# Patient Record
Sex: Male | Born: 1960 | Race: Black or African American | Hispanic: No | Marital: Single | Smoking: Current every day smoker
Health system: Southern US, Community
[De-identification: ages and names within clinical notes are randomized; demographics above are authoritative.]

## PROBLEM LIST (undated history)

## (undated) DIAGNOSIS — I252 Old myocardial infarction: Secondary | ICD-10-CM

## (undated) HISTORY — PX: NO PAST SURGERIES: SHX2092

---

## 1998-01-03 ENCOUNTER — Emergency Department (HOSPITAL_COMMUNITY): Admission: EM | Admit: 1998-01-03 | Discharge: 1998-01-03 | Payer: Self-pay | Admitting: Emergency Medicine

## 2002-11-10 ENCOUNTER — Encounter: Payer: Self-pay | Admitting: Emergency Medicine

## 2002-11-10 ENCOUNTER — Inpatient Hospital Stay (HOSPITAL_COMMUNITY): Admission: EM | Admit: 2002-11-10 | Discharge: 2002-11-13 | Payer: Self-pay | Admitting: Emergency Medicine

## 2003-06-15 ENCOUNTER — Emergency Department (HOSPITAL_COMMUNITY): Admission: EM | Admit: 2003-06-15 | Discharge: 2003-06-15 | Payer: Self-pay | Admitting: Emergency Medicine

## 2003-06-18 ENCOUNTER — Emergency Department (HOSPITAL_COMMUNITY): Admission: AD | Admit: 2003-06-18 | Discharge: 2003-06-18 | Payer: Self-pay | Admitting: Family Medicine

## 2004-02-01 ENCOUNTER — Emergency Department (HOSPITAL_COMMUNITY): Admission: EM | Admit: 2004-02-01 | Discharge: 2004-02-01 | Payer: Self-pay | Admitting: Family Medicine

## 2004-04-20 ENCOUNTER — Emergency Department (HOSPITAL_COMMUNITY): Admission: EM | Admit: 2004-04-20 | Discharge: 2004-04-20 | Payer: Self-pay | Admitting: Family Medicine

## 2005-03-23 ENCOUNTER — Encounter: Admission: RE | Admit: 2005-03-23 | Discharge: 2005-03-23 | Payer: Self-pay | Admitting: Occupational Medicine

## 2006-09-28 ENCOUNTER — Emergency Department (HOSPITAL_COMMUNITY): Admission: EM | Admit: 2006-09-28 | Discharge: 2006-09-28 | Payer: Self-pay | Admitting: Emergency Medicine

## 2014-03-13 ENCOUNTER — Emergency Department (HOSPITAL_COMMUNITY)
Admission: EM | Admit: 2014-03-13 | Discharge: 2014-03-13 | Disposition: A | Discharge status: Home / Self Care | Payer: Self-pay | Attending: Emergency Medicine | Admitting: Emergency Medicine

## 2014-03-13 ENCOUNTER — Encounter (HOSPITAL_COMMUNITY): Payer: Self-pay | Admitting: Emergency Medicine

## 2014-03-13 DIAGNOSIS — F17200 Nicotine dependence, unspecified, uncomplicated: Secondary | ICD-10-CM | POA: Insufficient documentation

## 2014-03-13 DIAGNOSIS — K122 Cellulitis and abscess of mouth: Secondary | ICD-10-CM | POA: Insufficient documentation

## 2014-03-13 DIAGNOSIS — Z880 Allergy status to penicillin: Secondary | ICD-10-CM | POA: Insufficient documentation

## 2014-03-13 DIAGNOSIS — I252 Old myocardial infarction: Secondary | ICD-10-CM | POA: Insufficient documentation

## 2014-03-13 DIAGNOSIS — K1370 Unspecified lesions of oral mucosa: Secondary | ICD-10-CM | POA: Insufficient documentation

## 2014-03-13 HISTORY — DX: Old myocardial infarction: I25.2

## 2014-03-13 MED ORDER — CLINDAMYCIN HCL 150 MG PO CAPS: 450.0000 mg | ORAL_CAPSULE | Freq: Three times a day (TID) | ORAL | Status: DC

## 2014-03-13 NOTE — ED Notes (Signed)
Heather, PA at the bedside.

## 2014-03-13 NOTE — Discharge Instructions (Signed)
Gargle with warm salt water.

## 2014-03-13 NOTE — ED Provider Notes (Signed)
CSN: [id number]     Arrival date & time 03/13/14  2633 History   First MD Initiated Contact with Patient 03/13/14 [phone]     Chief Complaint  Patient presents with  . mouth pain      (Consider location/radiation/quality/duration/timing/severity/associated sxs/prior Treatment) HPI Comments: Patient presents with an intermittent lesion of the right lower oral mucosa that has been present for 6-7 months.  He states that he has attempted to squeeze the area and got some pus out of it.  He reports that the lesion is tender.  He denies any injury to the area.  Denies any dental pain.  Denies any fever, chills, nausea, vomiting, or difficulty swallowing.    The history is provided by the patient.    Past Medical History  Diagnosis Date  . MI, old    History reviewed. No pertinent past surgical history. No family history on file. History  Substance Use Topics  . Smoking status: Current Every Day Smoker -- 0.50 packs/day    Types: Cigarettes  . Smokeless tobacco: Not on file  . Alcohol Use: Yes     Comment: occ    Review of Systems  Constitutional: Negative for fever and chills.  HENT: Positive for mouth sores. Negative for dental problem and facial swelling.       Allergies  Penicillins  Home Medications   Prior to Admission medications   Not on File   BP 136/93  Pulse 72  Temp(Src) 98.1 F (36.7 C) (Oral)  Resp 16  SpO2 97% Physical Exam  Nursing note and vitals reviewed. Constitutional: He appears well-developed and well-nourished.  HENT:  Head: Normocephalic and atraumatic.  Mouth/Throat: Uvula is midline and oropharynx is clear and moist. Oral lesions present. No trismus in the jaw.  1 cm abscess of the right lower oral mucosa.  No active drainage.    Neck: Normal range of motion. Neck supple.  Cardiovascular: Normal rate, regular rhythm and normal heart sounds.   Pulmonary/Chest: Effort normal and breath sounds normal.  Musculoskeletal: Normal range of motion.   Neurological: He is alert.  Skin: Skin is warm and dry.  Psychiatric: He has a normal mood and affect.    ED Course  Procedures (including critical care time) Labs Review Labs Reviewed - No data to display  Imaging Review No results found.   EKG Interpretation None     INCISION AND DRAINAGE Performed by: Hyman Bible Consent: Verbal consent obtained. Risks and benefits: risks, benefits and alternatives were discussed Type: abscess  Body area: oral mucosa  Anesthesia: local infiltration  Incision was made with a scalpel.  Local anesthetic: lidocaine 2% with epinephrine  Anesthetic total: 2 ml  Complexity: complex Blunt dissection to break up loculations  Drainage: purulent  Drainage amount: small  Patient tolerance: Patient tolerated the procedure well with no immediate complications.   MDM   Final diagnoses:  None   Patient presenting with an abscess of the oral mucosa.  Area incised and drained with good results.  Patient is afebrile.  Patient stable for discharge.  Patient discharged home with Rx for Clindamycin.  Stable for discharge.  Return precautions given.    Hyman Bible, PA-C 03/13/14 1725

## 2014-03-13 NOTE — ED Notes (Signed)
Pt to ED c/o sore to inside of R lower lip for several months.  He states he has been squeezing pus out of it, but in the past few days it has become a hard knot.

## 2014-03-14 NOTE — ED Provider Notes (Signed)
Medical screening examination/treatment/procedure(s) were performed by non-physician practitioner and as supervising physician I was immediately available for consultation/collaboration.   EKG Interpretation None        Debby Freiberg, MD 03/14/14 1428

## 2015-03-28 ENCOUNTER — Encounter (HOSPITAL_COMMUNITY): Payer: Self-pay | Admitting: Emergency Medicine

## 2015-03-28 ENCOUNTER — Emergency Department (HOSPITAL_COMMUNITY)
Admission: EM | Admit: 2015-03-28 | Discharge: 2015-03-28 | Disposition: A | Discharge status: Home / Self Care | Payer: Self-pay | Attending: Emergency Medicine | Admitting: Emergency Medicine

## 2015-03-28 DIAGNOSIS — Z792 Long term (current) use of antibiotics: Secondary | ICD-10-CM | POA: Insufficient documentation

## 2015-03-28 DIAGNOSIS — Z720 Tobacco use: Secondary | ICD-10-CM | POA: Insufficient documentation

## 2015-03-28 DIAGNOSIS — L259 Unspecified contact dermatitis, unspecified cause: Secondary | ICD-10-CM | POA: Insufficient documentation

## 2015-03-28 DIAGNOSIS — Z880 Allergy status to penicillin: Secondary | ICD-10-CM | POA: Insufficient documentation

## 2015-03-28 DIAGNOSIS — I252 Old myocardial infarction: Secondary | ICD-10-CM | POA: Insufficient documentation

## 2015-03-28 MED ORDER — DEXAMETHASONE SODIUM PHOSPHATE 10 MG/ML IJ SOLN
10.0000 mg | Freq: Once | INTRAMUSCULAR | Status: AC
  Administered 2015-03-28: 10 mg via INTRAMUSCULAR
  Filled 2015-03-28: qty 1

## 2015-03-28 NOTE — ED Notes (Signed)
Saltines and water provided

## 2015-03-28 NOTE — ED Notes (Signed)
Declined W/C at D/C and was escorted to lobby by RN. 

## 2015-03-28 NOTE — Discharge Instructions (Signed)
Please read and follow all provided instructions.  Your diagnoses today include:  1. Contact dermatitis    Tests performed today include:  Vital signs. See below for your results today.   Medications prescribed:   None  Take any prescribed medications only as directed.  Home care instructions:   Follow any educational materials contained in this packet  Follow-up instructions: Please follow-up with your primary care provider in the next 3 days for further evaluation of your symptoms if not improved.   Return instructions:   Please return to the Emergency Department if you experience worsening symptoms.   Call 9-1-1 immediately if you have an allergic reaction that involves your lips, mouth, throat or if you have any difficulty breathing. This is a life-threatening emergency.   Please return if you have any other emergent concerns.  Additional Information:  Your vital signs today were: BP 121/70 mmHg   Pulse 63   Temp(Src) 98.3 F (36.8 C) (Oral)   Ht 5\' 11"  (1.803 m)   Wt 145 lb (65.772 kg)   BMI 20.23 kg/m2   SpO2 95% If your blood pressure (BP) was elevated above 135/85 this visit, please have this repeated by your doctor within one month. --------------

## 2015-03-28 NOTE — ED Notes (Signed)
Rash to right forearm and right leg. raised red. Pruritic. Cortisone cream tried at home. NO change. NAD. Ambulatory with steady gait

## 2015-03-28 NOTE — ED Provider Notes (Signed)
CSN: [id number]     Arrival date & time 03/28/15  1314 History  This chart was scribed for Carlisle Cater, PA-C, working with Merrily Pew, MD by Starleen Arms, ED Scribe. This patient was seen in room TR07C/TR07C and the patient's care was started at 2:40 PM.   Chief Complaint  Patient presents with  . Rash   The history is provided by the patient. No language interpreter was used.    HPI Comments: Bryan Curtis is a 54 y.o. male who presents to the Emergency Department complaining of an itching rash onset yesterday, not relieved by cortisone/topical alcohol.  The patient initially noticed a single red area on the forearm yesterday after waking and noticed 3 similar areas on the right upper leg this morning after waking.  He reports he cleared weeds, including poison ivy, from around his shed two days ago.  He denies new living environment.     Past Medical History  Diagnosis Date  . MI, old    History reviewed. No pertinent past surgical history. History reviewed. No pertinent family history. Social History  Substance Use Topics  . Smoking status: Current Every Day Smoker -- 0.50 packs/day    Types: Cigarettes  . Smokeless tobacco: None  . Alcohol Use: Yes     Comment: occ    Review of Systems  Constitutional: Negative for fever.  HENT: Negative for facial swelling and trouble swallowing.   Eyes: Negative for redness.  Respiratory: Negative for shortness of breath, wheezing and stridor.   Cardiovascular: Negative for chest pain.  Gastrointestinal: Negative for nausea and vomiting.  Musculoskeletal: Negative for myalgias.  Skin: Positive for rash.  Neurological: Negative for light-headedness.  Psychiatric/Behavioral: Negative for confusion.      Allergies  Penicillins  Home Medications   Prior to Admission medications   Medication Sig Start Date End Date Taking? Authorizing Provider  clindamycin (CLEOCIN) 150 MG capsule Take 3 capsules (450 mg total) by mouth 3 (three)  times daily. 03/13/14   Heather Laisure, PA-C   BP 121/70 mmHg  Pulse 63  Temp(Src) 98.3 F (36.8 C) (Oral)  Ht 5\' 11"  (1.803 m)  Wt 145 lb (65.772 kg)  BMI 20.23 kg/m2  SpO2 95% Physical Exam  Constitutional: He is oriented to person, place, and time. He appears well-developed and well-nourished. No distress.  HENT:  Head: Normocephalic and atraumatic.  Eyes: Conjunctivae and EOM are normal.  Neck: Normal range of motion. Neck supple. No tracheal deviation present.  Cardiovascular: Normal rate.   Pulmonary/Chest: Effort normal. No respiratory distress.  Musculoskeletal: Normal range of motion.  Neurological: He is alert and oriented to person, place, and time.  Skin: Skin is warm and dry.  Patient with linear rash on right forearm and larger discrete wheals on lower right extremity consistent with contact dermatitis.  Psychiatric: He has a normal mood and affect. His behavior is normal.  Nursing note and vitals reviewed.   ED Course  Procedures (including critical care time)  DIAGNOSTIC STUDIES: Oxygen Saturation is 95% on RA, adequate by my interpretation.    COORDINATION OF CARE:  2:45 PM Will order IM steroids.  Patient may use OTC benadryl.  Patient acknowledges and agrees with plan.    Labs Review Labs Reviewed - No data to display  Imaging Review No results found. I have personally reviewed and evaluated these images and lab results as part of my medical decision-making.   EKG Interpretation None       Vital signs reviewed  and are as follows: Filed Vitals:   03/28/15 1333  BP: 121/70  Pulse: 63  Temp: 98.3 F (36.8 C)   Patient counseled to continue his steroid cream and that he should return if rash becomes worse.  MDM   Final diagnoses:  Contact dermatitis   Patient with itchy rash consistent with contact dermatitis. No signs of anaphylaxis. No other systemic symptoms of illness.  I personally performed the services described in this  documentation, which was scribed in my presence. The recorded information has been reviewed and is accurate.    Carlisle Cater, PA-C 03/28/15 1514  Merrily Pew, MD 03/30/15 [phone]

## 2015-04-27 ENCOUNTER — Encounter (HOSPITAL_COMMUNITY): Payer: Self-pay | Admitting: Emergency Medicine

## 2015-04-27 ENCOUNTER — Emergency Department (HOSPITAL_COMMUNITY)
Admission: EM | Admit: 2015-04-27 | Discharge: 2015-04-27 | Disposition: A | Discharge status: Home / Self Care | Payer: Self-pay | Attending: Emergency Medicine | Admitting: Emergency Medicine

## 2015-04-27 DIAGNOSIS — Z720 Tobacco use: Secondary | ICD-10-CM | POA: Insufficient documentation

## 2015-04-27 DIAGNOSIS — I252 Old myocardial infarction: Secondary | ICD-10-CM | POA: Insufficient documentation

## 2015-04-27 DIAGNOSIS — R21 Rash and other nonspecific skin eruption: Secondary | ICD-10-CM

## 2015-04-27 DIAGNOSIS — L237 Allergic contact dermatitis due to plants, except food: Secondary | ICD-10-CM

## 2015-04-27 DIAGNOSIS — Z792 Long term (current) use of antibiotics: Secondary | ICD-10-CM | POA: Insufficient documentation

## 2015-04-27 DIAGNOSIS — L259 Unspecified contact dermatitis, unspecified cause: Secondary | ICD-10-CM

## 2015-04-27 DIAGNOSIS — Z880 Allergy status to penicillin: Secondary | ICD-10-CM | POA: Insufficient documentation

## 2015-04-27 MED ORDER — HYDROXYZINE HCL 25 MG PO TABS: 25.0000 mg | ORAL_TABLET | Freq: Four times a day (QID) | ORAL | Status: DC | PRN

## 2015-04-27 MED ORDER — PREDNISONE 20 MG PO TABS: ORAL_TABLET | ORAL | Status: DC

## 2015-04-27 NOTE — ED Provider Notes (Signed)
CSN: [id number]     Arrival date & time 04/27/15  0906 History  By signing my name below, I, Starleen Arms, attest that this documentation has been prepared under the direction and in the presence of Wileen Duncanson Camprubi-Soms, PA-C. Electronically Signed: Starleen Arms ED Scribe. 04/27/2015. 9:21 AM.   Chief Complaint  Patient presents with  . Rash   Patient is a 54 y.o. male presenting with rash. The history is provided by the patient and medical records. No language interpreter was used.  Rash Location:  Full body Quality: itchiness and redness   Quality: not draining, not painful and not weeping   Severity:  Moderate Onset quality:  Gradual Duration:  1 month Timing:  Constant Progression:  Spreading Chronicity:  New Context: plant contact   Context: not animal contact, not exposure to similar rash, not food, not insect bite/sting, not medications, not new detergent/soap and not sick contacts   Relieved by:  Topical steroids Worsened by:  Nothing tried Ineffective treatments:  None tried Associated symptoms: no abdominal pain, no diarrhea, no fever, no joint pain, no myalgias, no nausea, no periorbital edema, no shortness of breath, no throat swelling, no tongue swelling and not vomiting    HPI Comments: Bryan Curtis is a 54 y.o. male who presents to the Emergency Department complaining of moderate, constant, slightly worsened, non-draining pruritic rash over the entire body onset 1 month ago; treated with OTC benadryl (no relief), topical cortisone ( with itching relief), and decadron (1 month ago in ED, no relief);  no aggravating factors.  The patient was seen in the ED for the same on 9/18.  At that time he reported having cleared weeds from around his shed and contacting poison ivy.  He was dx'd with contact dermatitis and given IM decadron, without improvement.  He denies new exposures, new living environment, sick contacts with similar symptoms, new soaps/detergents, new  medications, new foods, or new animal contacts.  He denies fever, chills, CP, SOB, abdominal pain, n/v/d/c, dysuria, hematuria, numbness, tingling, weakness, lip swelling, or throat fullness. No PCP.    Past Medical History  Diagnosis Date  . MI, old    History reviewed. No pertinent past surgical history. No family history on file. Social History  Substance Use Topics  . Smoking status: Current Every Day Smoker -- 0.50 packs/day    Types: Cigarettes  . Smokeless tobacco: None  . Alcohol Use: Yes     Comment: occ    Review of Systems  Constitutional: Negative for fever and chills.  HENT: Negative for facial swelling.   Respiratory: Negative for shortness of breath.   Cardiovascular: Negative for chest pain.  Gastrointestinal: Negative for nausea, vomiting, abdominal pain, diarrhea and constipation.  Genitourinary: Negative for dysuria and hematuria.  Musculoskeletal: Negative for myalgias and arthralgias.  Skin: Positive for color change and rash.  Allergic/Immunologic: Negative for immunocompromised state.  Neurological: Negative for weakness and numbness.  Psychiatric/Behavioral: Negative for confusion.  A complete 10 system review of systems was obtained and all systems are negative except as noted in the HPI and PMH.    Allergies  Penicillins  Home Medications   Prior to Admission medications   Medication Sig Start Date End Date Taking? Authorizing Provider  clindamycin (CLEOCIN) 150 MG capsule Take 3 capsules (450 mg total) by mouth 3 (three) times daily. 03/13/14   Heather Laisure, PA-C  hydrOXYzine (ATARAX/VISTARIL) 25 MG tablet Take 1 tablet (25 mg total) by mouth every 6 (six) hours as needed  for itching. 04/27/15   Aislee Landgren Camprubi-Soms, PA-C  predniSONE (DELTASONE) 20 MG tablet 3 tabs po daily x 3 days, then 2 tabs x 3 days, then 1.5 tabs x 3 days, then 1 tab x 3 days, then 0.5 tabs x 3 days 04/27/15   Ceri Mayer Camprubi-Soms, PA-C   BP 141/87 mmHg  Pulse 70   Temp(Src) 98.8 F (37.1 C) (Oral)  Resp 16  SpO2 99% Physical Exam  Constitutional: He is oriented to person, place, and time. Vital signs are normal. He appears well-developed and well-nourished.  Non-toxic appearance. No distress.  Afebrile, nontoxic, NAD  HENT:  Head: Normocephalic and atraumatic.  Mouth/Throat: Mucous membranes are normal.  Eyes: Conjunctivae and EOM are normal. Right eye exhibits no discharge. Left eye exhibits no discharge.  Neck: Normal range of motion. Neck supple.  Cardiovascular: Normal rate.   Pulmonary/Chest: Effort normal. No respiratory distress.  Abdominal: Normal appearance. He exhibits no distension.  Musculoskeletal: Normal range of motion.  Neurological: He is alert and oriented to person, place, and time. He has normal strength. No sensory deficit.  Skin: Skin is warm, dry and intact. Rash noted. Rash is urticarial.  Multiple erythematous linear lesions with somewhat urticarial appearance to the left arm and both legs as well as the anterior neck.  No interdigital web space involvement.  No intertrigonus involvement.  No warmth or drainage.  No fluctuance.    Psychiatric: He has a normal mood and affect.  Nursing note and vitals reviewed.   ED Course  Procedures (including critical care time)  DIAGNOSTIC STUDIES: Oxygen Saturation is 100% on RA, normal by my interpretation.    COORDINATION OF CARE:  9:18 AM Will prescribe prednisone and vistaril.  Patient acknowledges and agrees with plan.    Labs Review Labs Reviewed - No data to display  Imaging Review No results found. I have personally reviewed and evaluated these images and lab results as part of my medical decision-making.   EKG Interpretation None      MDM   Final diagnoses:  Rash  Contact dermatitis  Poison ivy dermatitis    54 y.o. male here with erythematous itchy rash, linear distribution in multiple areas where his clothing does not cover. No evidence of cellulitis.  Doubt scabies or yeast. Likely contact dermatitis incompletely treated last time since he didn't have a pred taper. Will give vistaril and pred burst with taper. F/up with [place] in 2wks to f/up and to establish medical care. I explained the diagnosis and have given explicit precautions to return to the ER including for any other new or worsening symptoms. The patient understands and accepts the medical plan as it's been dictated and I have answered their questions. Discharge instructions concerning home care and prescriptions have been given. The patient is STABLE and is discharged to home in good condition.   I personally performed the services described in this documentation, which was scribed in my presence. The recorded information has been reviewed and is accurate.  BP 141/87 mmHg  Pulse 70  Temp(Src) 98.8 F (37.1 C) (Oral)  Resp 16  SpO2 99%  Meds ordered this encounter  Medications  . predniSONE (DELTASONE) 20 MG tablet    Sig: 3 tabs po daily x 3 days, then 2 tabs x 3 days, then 1.5 tabs x 3 days, then 1 tab x 3 days, then 0.5 tabs x 3 days    Dispense:  27 tablet    Refill:  0    Order Specific Question:  Supervising Provider    Answer:  Noemi Chapel [[id number]]  . hydrOXYzine (ATARAX/VISTARIL) 25 MG tablet    Sig: Take 1 tablet (25 mg total) by mouth every 6 (six) hours as needed for itching.    Dispense:  28 tablet    Refill:  0    Order Specific Question:  Supervising Provider    Answer:  Noemi Chapel [[id number]]      Megham Dwyer Camprubi-Soms, PA-C 04/27/15 8413  Harvel Quale, MD 04/27/15 2152

## 2015-04-27 NOTE — Discharge Instructions (Signed)
Take Prednisone and vistaril as prescribed. Continue your usual home medications. Get plenty of rest and drink plenty of fluids. Avoid any known triggers. Please followup with  and wellness in 2 weeks to establish medical care and to follow up after today's visit. Return to the ER for changes or worsening symptoms.   Contact Dermatitis Dermatitis is redness, soreness, and swelling (inflammation) of the skin. Contact dermatitis is a reaction to certain substances that touch the skin. You either touched something that irritated your skin, or you have allergies to something you touched.  HOME CARE  Skin Care  Moisturize your skin as needed.  Apply cool compresses to the affected areas.   Try taking a bath with:   Epsom salts. Follow the instructions on the package. You can get these at a pharmacy or grocery store.   Baking soda. Pour a small amount into the bath as told by your doctor.   Colloidal oatmeal. Follow the instructions on the package. You can get this at a pharmacy or grocery store.   Try applying baking soda paste to your skin. Stir water into baking soda until it looks like paste.  Do not scratch your skin.   Bathe less often.  Bathe in lukewarm water. Avoid using hot water.  Medicines  Take or apply over-the-counter and prescription medicines only as told by your doctor.   If you were prescribed an antibiotic medicine, take or apply your antibiotic as told by your doctor. Do not stop taking the antibiotic even if your condition starts to get better. General Instructions  Keep all follow-up visits as told by your doctor. This is important.   Avoid the substance that caused your reaction. If you do not know what caused it, keep a journal to try to track what caused it. Write down:   What you eat.   What cosmetic products you use.   What you drink.   What you wear in the affected area. This includes jewelry.   If you were given a bandage  (dressing), take care of it as told by your doctor. This includes when to change and remove it.  GET HELP IF:   You do not get better with treatment.   Your condition gets worse.   You have signs of infection such as:  Swelling.  Tenderness.  Redness.  Soreness.  Warmth.   You have a fever.   You have new symptoms.  GET HELP RIGHT AWAY IF:   You have a very bad headache.  You have neck pain.  Your neck is stiff.   You throw up (vomit).   You feel very sleepy.   You see red streaks coming from the affected area.   Your bone or joint underneath the affected area becomes painful after the skin has healed.   The affected area turns darker.   You have trouble breathing.    This information is not intended to replace advice given to you by your health care provider. Make sure you discuss any questions you have with your health care provider.   Document Released: 04/23/2009 Document Revised: 03/17/2015 Document Reviewed: 11/11/2014 Elsevier Interactive Patient Education 2016 [place] ivy is a inflammation of the skin (contact dermatitis) caused by touching the allergens on the leaves of the ivy plant following previous exposure to the plant. The rash usually appears 48 hours after exposure. The rash is usually bumps (papules) or blisters (vesicles) in a linear pattern. Depending on  your own sensitivity, the rash may simply cause redness and itching, or it may also progress to blisters which may break open. These must be well cared for to prevent secondary bacterial (germ) infection, followed by scarring. Keep any open areas dry, clean, dressed, and covered with an antibacterial ointment if needed. The eyes may also get puffy. The puffiness is worst in the morning and gets better as the day progresses. This dermatitis usually heals without scarring, within 2 to 3 weeks without treatment. HOME CARE INSTRUCTIONS  Thoroughly wash with  soap and water as soon as you have been exposed to poison ivy. You have about one half hour to remove the plant resin before it will cause the rash. This washing will destroy the oil or antigen on the skin that is causing, or will cause, the rash. Be sure to wash under your fingernails as any plant resin there will continue to spread the rash. Do not rub skin vigorously when washing affected area. Poison ivy cannot spread if no oil from the plant remains on your body. A rash that has progressed to weeping sores will not spread the rash unless you have not washed thoroughly. It is also important to wash any clothes you have been wearing as these may carry active allergens. The rash will return if you wear the unwashed clothing, even several days later. Avoidance of the plant in the future is the best measure. Poison ivy plant can be recognized by the number of leaves. Generally, poison ivy has three leaves with flowering branches on a single stem. Diphenhydramine may be purchased over the counter and used as needed for itching. Do not drive with this medication if it makes you drowsy.Ask your caregiver about medication for children. SEEK MEDICAL CARE IF:  Open sores develop.  Redness spreads beyond area of rash.  You notice purulent (pus-like) discharge.  You have increased pain.  Other signs of infection develop (such as fever).   This information is not intended to replace advice given to you by your health care provider. Make sure you discuss any questions you have with your health care provider.   Document Released: 06/23/2000 Document Revised: 09/18/2011 Document Reviewed: 12/02/2014 Elsevier Interactive Patient Education [employer].

## 2015-04-27 NOTE — ED Notes (Signed)
Patient states continued rash from last months exposure to poison oak.  Patient states it's all over at this time.   Itchy and red.

## 2015-11-03 ENCOUNTER — Ambulatory Visit (HOSPITAL_COMMUNITY)
Admission: EM | Admit: 2015-11-03 | Discharge: 2015-11-03 | Disposition: A | Discharge status: Home / Self Care | Payer: Self-pay | Attending: Emergency Medicine | Admitting: Emergency Medicine

## 2015-11-03 ENCOUNTER — Encounter (HOSPITAL_COMMUNITY): Payer: Self-pay | Admitting: Emergency Medicine

## 2015-11-03 DIAGNOSIS — I252 Old myocardial infarction: Secondary | ICD-10-CM | POA: Insufficient documentation

## 2015-11-03 DIAGNOSIS — Z79899 Other long term (current) drug therapy: Secondary | ICD-10-CM | POA: Insufficient documentation

## 2015-11-03 DIAGNOSIS — Z880 Allergy status to penicillin: Secondary | ICD-10-CM | POA: Insufficient documentation

## 2015-11-03 DIAGNOSIS — F17210 Nicotine dependence, cigarettes, uncomplicated: Secondary | ICD-10-CM | POA: Insufficient documentation

## 2015-11-03 DIAGNOSIS — L259 Unspecified contact dermatitis, unspecified cause: Secondary | ICD-10-CM | POA: Insufficient documentation

## 2015-11-03 DIAGNOSIS — R21 Rash and other nonspecific skin eruption: Secondary | ICD-10-CM | POA: Insufficient documentation

## 2015-11-03 MED ORDER — TRIAMCINOLONE ACETONIDE 0.1 % EX CREA: 1.0000 "application " | TOPICAL_CREAM | Freq: Two times a day (BID) | CUTANEOUS | Status: DC

## 2015-11-03 MED ORDER — BACITRACIN-POLYMYXIN B 500-10000 UNIT/GM EX OINT: TOPICAL_OINTMENT | Freq: Two times a day (BID) | CUTANEOUS | Status: DC

## 2015-11-03 NOTE — ED Notes (Signed)
Pt has a rash on his left arm and BLE that has been there for 3-4 months and is getting progressively worse.  He states he was seen in the ED for this and was diagnosed with dermatitis and given two prescriptions.  He states these did not help.

## 2015-11-03 NOTE — Discharge Instructions (Signed)
Contact Dermatitis Apply both of these creams twice a day but not at the same time. Cover the open areas with a nonadherent dressing. Wash the area daily with mild soap and warm water. Dermatitis is redness, soreness, and swelling (inflammation) of the skin. Contact dermatitis is a reaction to certain substances that touch the skin. There are two types of contact dermatitis:   Irritant contact dermatitis. This type is caused by something that irritates your skin, such as dry hands from washing them too much. This type does not require previous exposure to the substance for a reaction to occur. This type is more common.  Allergic contact dermatitis. This type is caused by a substance that you are allergic to, such as a nickel allergy or poison ivy. This type only occurs if you have been exposed to the substance (allergen) before. Upon a repeat exposure, your body reacts to the substance. This type is less common. CAUSES  Many different substances can cause contact dermatitis. Irritant contact dermatitis is most commonly caused by exposure to:   Makeup.   Soaps.   Detergents.   Bleaches.   Acids.   Metal salts, such as nickel.  Allergic contact dermatitis is most commonly caused by exposure to:   Poisonous plants.   Chemicals.   Jewelry.   Latex.   Medicines.   Preservatives in products, such as clothing.  RISK FACTORS This condition is more likely to develop in:   People who have jobs that expose them to irritants or allergens.  People who have certain medical conditions, such as asthma or eczema.  SYMPTOMS  Symptoms of this condition may occur anywhere on your body where the irritant has touched you or is touched by you. Symptoms include:  Dryness or flaking.   Redness.   Cracks.   Itching.   Pain or a burning feeling.   Blisters.  Drainage of small amounts of blood or clear fluid from skin cracks. With allergic contact dermatitis, there may  also be swelling in areas such as the eyelids, mouth, or genitals.  DIAGNOSIS  This condition is diagnosed with a medical history and physical exam. A patch skin test may be performed to help determine the cause. If the condition is related to your job, you may need to see an occupational medicine specialist. TREATMENT Treatment for this condition includes figuring out what caused the reaction and protecting your skin from further contact. Treatment may also include:   Steroid creams or ointments. Oral steroid medicines may be needed in more severe cases.  Antibiotics or antibacterial ointments, if a skin infection is present.  Antihistamine lotion or an antihistamine taken by mouth to ease itching.  A bandage (dressing). HOME CARE INSTRUCTIONS Skin Care  Moisturize your skin as needed.   Apply cool compresses to the affected areas.  Try taking a bath with:  Epsom salts. Follow the instructions on the packaging. You can get these at your local pharmacy or grocery store.  Baking soda. Pour a small amount into the bath as directed by your health care provider.  Colloidal oatmeal. Follow the instructions on the packaging. You can get this at your local pharmacy or grocery store.  Try applying baking soda paste to your skin. Stir water into baking soda until it reaches a paste-like consistency.  Do not scratch your skin.  Bathe less frequently, such as every other day.  Bathe in lukewarm water. Avoid using hot water. Medicines  Take or apply over-the-counter and prescription medicines only as  told by your health care provider.   If you were prescribed an antibiotic medicine, take or apply your antibiotic as told by your health care provider. Do not stop using the antibiotic even if your condition starts to improve. General Instructions  Keep all follow-up visits as told by your health care provider. This is important.  Avoid the substance that caused your reaction. If you  do not know what caused it, keep a journal to try to track what caused it. Write down:  What you eat.  What cosmetic products you use.  What you drink.  What you wear in the affected area. This includes jewelry.  If you were given a dressing, take care of it as told by your health care provider. This includes when to change and remove it. SEEK MEDICAL CARE IF:   Your condition does not improve with treatment.  Your condition gets worse.  You have signs of infection such as swelling, tenderness, redness, soreness, or warmth in the affected area.  You have a fever.  You have new symptoms. SEEK IMMEDIATE MEDICAL CARE IF:   You have a severe headache, neck pain, or neck stiffness.  You vomit.  You feel very sleepy.  You notice red streaks coming from the affected area.  Your bone or joint underneath the affected area becomes painful after the skin has healed.  The affected area turns darker.  You have difficulty breathing.   This information is not intended to replace advice given to you by your health care provider. Make sure you discuss any questions you have with your health care provider.   Document Released: 06/23/2000 Document Revised: 03/17/2015 Document Reviewed: 11/11/2014 Elsevier Interactive Patient Education [employer].

## 2015-11-03 NOTE — ED Provider Notes (Signed)
CSN: [id number]     Arrival date & time 11/03/15  1512 History   First MD Initiated Contact with Patient 11/03/15 1631     Chief Complaint  Patient presents with  . Rash   (Consider location/radiation/quality/duration/timing/severity/associated sxs/prior Treatment) HPI Comments: 55 year old male presents to the urgent care with concern of rash and lesionsto the scan of his lower extremities and left upper extremity. He states these lesions have been present for part for 3-4 months. 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 He has been to the emergency department twice for this and pain and given medications. He states that they did not work. He has been scratching these lesions excessively which has produced open areas in these locations. Denies systemic symptoms.   Past Medical History  Diagnosis Date  . MI, old    History reviewed. No pertinent past surgical history. History reviewed. No pertinent family history. Social History  Substance Use Topics  . Smoking status: Current Every Day Smoker -- 0.50 packs/day    Types: Cigarettes  . Smokeless tobacco: None  . Alcohol Use: Yes     Comment: occ    Review of Systems  Constitutional: Negative.   HENT: Negative.   Respiratory: Negative.   Gastrointestinal: Negative.   Musculoskeletal: Negative.   Skin: Positive for rash.  Neurological: Negative.   All other systems reviewed and are negative.   Allergies  Penicillins  Home Medications   Prior to Admission medications   Medication Sig Start Date End Date Taking? Authorizing Provider  bacitracin-polymyxin b (POLYSPORIN) ointment Apply topically 2 (two) times daily. 11/03/15   Janne Napoleon, NP  triamcinolone cream (KENALOG) 0.1 % Apply 1 application topically 2 (two) times daily. 11/03/15   Janne Napoleon, NP   Meds Ordered and Administered this Visit  Medications - No data to display  BP 131/84 mmHg  Pulse 87   Temp(Src) 98.9 F (37.2 C) (Oral)  Resp 16  SpO2 100% No data found.   Physical Exam  Constitutional: He is oriented to person, place, and time. He appears well-developed and well-nourished. No distress.  Eyes: EOM are normal.  Neck: Normal range of motion. Neck supple.  Cardiovascular: Normal rate.   Pulmonary/Chest: Effort normal. No respiratory distress.  Musculoskeletal: He exhibits no edema.  Neurological: He is alert and oriented to person, place, and time. He exhibits normal muscle tone.  Skin: Skin is warm and dry.  Therefore primary lesions. These are areas with a central annular to ovoid lesion which appears to be open in that the epidermis has been scratched off.. There are surrounding satellite papules. All are highly pruritic. There are several areas in which there is a honey colored discharged from the papules and the central open lesions. No clear evidence of infection. No cellulitis or lymphangitis.  Psychiatric: He has a normal mood and affect.  Nursing note and vitals reviewed.   ED Course  Procedures (including critical care time)  Labs Review Labs Reviewed - No data to display 1  2 3   1. Left shin 2. R shin 3. Left antecubital fossa  Imaging Review No results found.   Visual Acuity Review  Right Eye Distance:   Left Eye Distance:   Bilateral Distance:    Right Eye Near:   Left Eye Near:    Bilateral Near:         MDM   1. Contact dermatitis    Apply  both of these creams twice a day but not at the same time. Cover the open areas with a nonadherent dressing. Wash the area daily with mild soap and warm water. Meds ordered this encounter  Medications  . triamcinolone cream (KENALOG) 0.1 %    Sig: Apply 1 application topically 2 (two) times daily.    Dispense:  30 g    Refill:  0    Order Specific Question:  Supervising Provider    Answer:  Melony Overly [id number]  . bacitracin-polymyxin b (POLYSPORIN) ointment    Sig: Apply topically 2  (two) times daily.    Dispense:  30 g    Refill:  0    Order Specific Question:  Supervising Provider    Answer:  Melony Overly [id number]      Janne Napoleon, NP 11/03/15 1737  Janne Napoleon, NP 11/03/15 1812

## 2015-11-06 LAB — CULTURE, ROUTINE-ABSCESS
Culture: NORMAL
Gram Stain: NONE SEEN

## 2015-12-12 ENCOUNTER — Encounter (HOSPITAL_COMMUNITY): Payer: Self-pay | Admitting: Emergency Medicine

## 2015-12-12 ENCOUNTER — Emergency Department (HOSPITAL_COMMUNITY)
Admission: EM | Admit: 2015-12-12 | Discharge: 2015-12-12 | Disposition: A | Discharge status: Home / Self Care | Payer: Self-pay | Attending: Emergency Medicine | Admitting: Emergency Medicine

## 2015-12-12 DIAGNOSIS — Z7952 Long term (current) use of systemic steroids: Secondary | ICD-10-CM | POA: Insufficient documentation

## 2015-12-12 DIAGNOSIS — I252 Old myocardial infarction: Secondary | ICD-10-CM | POA: Insufficient documentation

## 2015-12-12 DIAGNOSIS — L280 Lichen simplex chronicus: Secondary | ICD-10-CM | POA: Insufficient documentation

## 2015-12-12 DIAGNOSIS — Z792 Long term (current) use of antibiotics: Secondary | ICD-10-CM | POA: Insufficient documentation

## 2015-12-12 DIAGNOSIS — F17210 Nicotine dependence, cigarettes, uncomplicated: Secondary | ICD-10-CM | POA: Insufficient documentation

## 2015-12-12 DIAGNOSIS — Z880 Allergy status to penicillin: Secondary | ICD-10-CM | POA: Insufficient documentation

## 2015-12-12 MED ORDER — HYDROXYZINE HCL 25 MG PO TABS: 25.0000 mg | ORAL_TABLET | Freq: Four times a day (QID) | ORAL | Status: DC

## 2015-12-12 MED ORDER — TRIAMCINOLONE ACETONIDE 0.5 % EX OINT: 1.0000 "application " | TOPICAL_OINTMENT | Freq: Two times a day (BID) | CUTANEOUS | Status: DC

## 2015-12-12 NOTE — ED Notes (Signed)
Pt. Stated, rash on outer calves for over a year.  Comes here for treatrment

## 2015-12-12 NOTE — ED Provider Notes (Signed)
CSN: [id number]     Arrival date & time 12/12/15  1045 History  By signing my name below, I, Soijett Blue, attest that this documentation has been prepared under the direction and in the presence of Recardo Evangelist, PA-C Electronically Signed: Soijett Blue, ED Scribe. 12/12/2015. 11:52 AM.     The history is provided by the patient. No language interpreter was used.    Bryan Curtis is a 55 y.o. male who presents to the Emergency Department complaining of pruritic chronic rash to bilateral outer calves onset 1 year. Pt states that he has intermittent pain to the areas due to the pruritic scratching. Pt notes that he has pus to the areas following taking a shower. Pt reports that he has been dx with contact dermatitis and Rx medications. Pt has been evaluated in the ED and urgent care office. Pt states that he is still scratching the areas intermittently. He notes that he has tried Rx medications for the relief of his symptoms. He denies fever, chills, and any other symptoms. Denies having a PCP at this time. Pt denies having insurance at this time.   Per pt chart review: Pt was last seen in the Urgent Care on 11/03/2015 for rash. Pt was treated with kenalog and polysporin. Pt was informed to follow up with dermatology.    Past Medical History  Diagnosis Date  . MI, old    History reviewed. No pertinent past surgical history. No family history on file. Social History  Substance Use Topics  . Smoking status: Current Every Day Smoker -- 0.50 packs/day    Types: Cigarettes  . Smokeless tobacco: None  . Alcohol Use: Yes     Comment: occ    Review of Systems  Constitutional: Negative for fever and chills.  Musculoskeletal: Negative for gait problem.  Skin: Positive for rash.     Allergies  Penicillins  Home Medications   Prior to Admission medications   Medication Sig Start Date End Date Taking? Authorizing Provider  bacitracin-polymyxin b (POLYSPORIN) ointment Apply topically 2  (two) times daily. 11/03/15   Janne Napoleon, NP  triamcinolone cream (KENALOG) 0.1 % Apply 1 application topically 2 (two) times daily. 11/03/15   Janne Napoleon, NP   BP 138/88 mmHg  Pulse 94  Temp(Src) 99.2 F (37.3 C) (Oral)  Resp 14  SpO2 99%   Physical Exam  Constitutional: He is oriented to person, place, and time. He appears well-developed and well-nourished. No distress.  HENT:  Head: Normocephalic and atraumatic.  Eyes: Conjunctivae are normal. Pupils are equal, round, and reactive to light. Right eye exhibits no discharge. Left eye exhibits no discharge. No scleral icterus.  Neck: Normal range of motion.  Cardiovascular: Normal rate.   Pulmonary/Chest: Effort normal. No respiratory distress.  Abdominal: Soft. He exhibits no distension.  Neurological: He is alert and oriented to person, place, and time.  Skin: Skin is warm and dry. There is erythema.  Raised plaques on anterior shins with excoriations and lichenification. Minimal crusting. No signs of infection. No pus drainage. Minimal erythema.   Psychiatric: He has a normal mood and affect.  Nursing note and vitals reviewed.   ED Course  Procedures (including critical care time) DIAGNOSTIC STUDIES: Oxygen Saturation is 99% on RA, nl by my interpretation.    COORDINATION OF CARE: 11:47 AM Discussed treatment plan with pt at bedside which includes referral to dermatology and pt agreed to plan.    MDM   Final diagnoses:  Lichen simplex chronicus  55 year old male who presents with chronic rash consistent with LSC. He endorses scratching the area frequently but the medicines have helped. Steroid ointment and atarax refilled. Strongly recommended derm follow up as his skin has extensive lichenification. No signs of infection at this time. Patient is NAD, non-toxic, with stable VS. Patient is informed of clinical course, understands medical decision making process, and agrees with plan. Opportunity for questions provided and  all questions answered. Return precautions given.   I personally performed the services described in this documentation, which was scribed in my presence. The recorded information has been reviewed and is accurate.   Recardo Evangelist, PA-C 12/13/15 1333  Charlesetta Shanks, MD 12/13/15 [phone]

## 2016-04-20 ENCOUNTER — Encounter (HOSPITAL_COMMUNITY): Payer: Self-pay | Admitting: Emergency Medicine

## 2016-04-20 ENCOUNTER — Ambulatory Visit (HOSPITAL_COMMUNITY)
Admission: EM | Admit: 2016-04-20 | Discharge: 2016-04-20 | Disposition: A | Discharge status: Home / Self Care | Payer: Self-pay | Attending: Family Medicine | Admitting: Family Medicine

## 2016-04-20 DIAGNOSIS — R21 Rash and other nonspecific skin eruption: Secondary | ICD-10-CM

## 2016-04-20 MED ORDER — TRIAMCINOLONE ACETONIDE 0.1 % EX CREA: 1.0000 "application " | TOPICAL_CREAM | Freq: Two times a day (BID) | CUTANEOUS | 1 refills | Status: DC

## 2016-04-20 NOTE — ED Triage Notes (Signed)
C/o bilateral leg and groin area rash  States he ran out of ointment for a month now

## 2016-04-20 NOTE — ED Provider Notes (Signed)
CSN: [id number]     Arrival date & time 04/20/16  1000 History   First MD Initiated Contact with Patient 04/20/16 1013     Chief Complaint  Patient presents with  . Rash   (Consider location/radiation/quality/duration/timing/severity/associated sxs/prior Treatment) Patient is here for c/o rash on lower extremities.  He ran out of his steroid cream and his rash has returned.   The history is provided by the patient.  Rash  Location:  Leg Leg rash location:  L leg and R leg Quality: itchiness and redness   Severity:  Moderate Duration:  1 month Timing:  Constant Progression:  Worsening Chronicity:  Chronic Relieved by:  Topical steroids Worsened by:  Nothing Ineffective treatments:  Topical steroids   Past Medical History:  Diagnosis Date  . MI, old    History reviewed. No pertinent surgical history. History reviewed. No pertinent family history. Social History  Substance Use Topics  . Smoking status: Current Every Day Smoker    Packs/day: 0.50    Types: Cigarettes  . Smokeless tobacco: Not on file  . Alcohol use Yes     Comment: occ    Review of Systems  Constitutional: Negative.   HENT: Negative.   Eyes: Negative.   Respiratory: Negative.   Cardiovascular: Negative.   Gastrointestinal: Negative.   Endocrine: Negative.   Genitourinary: Negative.   Musculoskeletal: Negative.   Skin: Positive for rash.  Allergic/Immunologic: Negative.   Neurological: Negative.   Hematological: Negative.   Psychiatric/Behavioral: Negative.     Allergies  Penicillins  Home Medications   Prior to Admission medications   Medication Sig Start Date End Date Taking? Authorizing Provider  bacitracin-polymyxin b (POLYSPORIN) ointment Apply topically 2 (two) times daily. 11/03/15   Janne Napoleon, NP  hydrOXYzine (ATARAX/VISTARIL) 25 MG tablet Take 1 tablet (25 mg total) by mouth every 6 (six) hours. 12/12/15   Recardo Evangelist, PA-C  triamcinolone cream (KENALOG) 0.1 % Apply 1  application topically 2 (two) times daily. 04/20/16   Lysbeth Penner, FNP  triamcinolone ointment (KENALOG) 0.5 % Apply 1 application topically 2 (two) times daily. 12/12/15   Recardo Evangelist, PA-C   Meds Ordered and Administered this Visit  Medications - No data to display  BP 123/81 (BP Location: Left Arm)   Pulse 90   Temp 98.4 F (36.9 C) (Oral)   Resp 16   SpO2 97%  No data found.   Physical Exam  Constitutional: He appears well-developed and well-nourished.  HENT:  Head: Normocephalic and atraumatic.  Eyes: EOM are normal. Pupils are equal, round, and reactive to light.  Neck: Normal range of motion.  Cardiovascular: Normal rate, regular rhythm and normal heart sounds.   Pulmonary/Chest: Effort normal and breath sounds normal.  Skin: Rash noted.  Raised erythematous dry rash on bilateral lower extremities  Nursing note and vitals reviewed.   Urgent Care Course   Clinical Course    Procedures (including critical care time)  Labs Review Labs Reviewed - No data to display  Imaging Review No results found.   Visual Acuity Review  Right Eye Distance:   Left Eye Distance:   Bilateral Distance:    Right Eye Near:   Left Eye Near:    Bilateral Near:         MDM   1. Rash    Triamcinolone cream 0.1% apply bid #80grams w/1 rf    Lysbeth Penner, FNP 04/20/16 1024

## 2017-05-29 ENCOUNTER — Encounter (HOSPITAL_COMMUNITY): Payer: Self-pay | Admitting: Emergency Medicine

## 2017-05-29 ENCOUNTER — Emergency Department (HOSPITAL_COMMUNITY)
Admission: EM | Admit: 2017-05-29 | Discharge: 2017-05-29 | Disposition: A | Discharge status: Home / Self Care | Payer: Self-pay | Attending: Emergency Medicine | Admitting: Emergency Medicine

## 2017-05-29 ENCOUNTER — Emergency Department (HOSPITAL_COMMUNITY): Payer: Self-pay

## 2017-05-29 DIAGNOSIS — Z5321 Procedure and treatment not carried out due to patient leaving prior to being seen by health care provider: Secondary | ICD-10-CM | POA: Insufficient documentation

## 2017-05-29 DIAGNOSIS — R079 Chest pain, unspecified: Secondary | ICD-10-CM | POA: Insufficient documentation

## 2017-05-29 LAB — BASIC METABOLIC PANEL
Anion gap: 7 (ref 5–15)
BUN: 7 mg/dL (ref 6–20)
CO2: 27 mmol/L (ref 22–32)
Calcium: 9 mg/dL (ref 8.9–10.3)
Chloride: 102 mmol/L (ref 101–111)
Creatinine, Ser: 0.94 mg/dL (ref 0.61–1.24)
GFR calc Af Amer: >60 mL/min (ref >60)
GFR calc non Af Amer: >60 mL/min (ref >60)
Glucose, Bld: 133 mg/dL — ABNORMAL HIGH (ref 65–99)
Potassium: 3.4 mmol/L — ABNORMAL LOW (ref 3.5–5.1)
Sodium: 136 mmol/L (ref 135–145)

## 2017-05-29 LAB — CBC
HCT: 44.2 % (ref 39.0–52.0)
Hemoglobin: 15 g/dL (ref 13.0–17.0)
MCH: 27.6 pg (ref 26.0–34.0)
MCHC: 33.9 g/dL (ref 30.0–36.0)
MCV: 81.3 fL (ref 78.0–100.0)
Platelets: 417 10*3/uL — ABNORMAL HIGH (ref 150–400)
RBC: 5.44 MIL/uL (ref 4.22–5.81)
RDW: 15.3 % (ref 11.5–15.5)
WBC: 10.1 10*3/uL (ref 4.0–10.5)

## 2017-05-29 LAB — I-STAT TROPONIN, ED: Troponin i, poc: 0 ng/mL (ref 0.00–0.08)

## 2017-05-29 NOTE — ED Notes (Signed)
Pt stated he feels better and would like to go home. Patient stated he did not want to wait anymore

## 2017-05-29 NOTE — ED Triage Notes (Signed)
Per EMS, pt c/o centralized chest pain that began at 0430 yesterday morning. Pain is non radiating, denies shortness of breath/diaphoresis,weakness. Pt took 324 mg aspirin PTA. Denies chest pain at this time. Hx MI in 2002.

## 2017-10-15 ENCOUNTER — Encounter (HOSPITAL_COMMUNITY): Payer: Self-pay | Admitting: Family Medicine

## 2017-10-15 ENCOUNTER — Ambulatory Visit (HOSPITAL_COMMUNITY)
Admission: EM | Admit: 2017-10-15 | Discharge: 2017-10-15 | Disposition: A | Discharge status: Home / Self Care | Payer: Self-pay | Attending: Internal Medicine | Admitting: Internal Medicine

## 2017-10-15 DIAGNOSIS — R0981 Nasal congestion: Secondary | ICD-10-CM

## 2017-10-15 DIAGNOSIS — J209 Acute bronchitis, unspecified: Secondary | ICD-10-CM

## 2017-10-15 MED ORDER — ALBUTEROL SULFATE HFA 108 (90 BASE) MCG/ACT IN AERS: 1.0000 | INHALATION_SPRAY | RESPIRATORY_TRACT | 0 refills | Status: DC | PRN

## 2017-10-15 MED ORDER — BENZONATATE 200 MG PO CAPS: 200.0000 mg | ORAL_CAPSULE | Freq: Three times a day (TID) | ORAL | 1 refills | Status: DC | PRN

## 2017-10-15 MED ORDER — PREDNISONE 50 MG PO TABS: 50.0000 mg | ORAL_TABLET | Freq: Every day | ORAL | 0 refills | Status: DC

## 2017-10-15 NOTE — Discharge Instructions (Addendum)
Anticipate gradual improvement in cough/congestion over the next several days.  Prescriptions for prednisone (steroid, for cough, chest tightness, sinus congestion), benzonatate (for cough), and albuterol inhaler were sent to the pharmacy.  Note for work today.  Push fluids and rest.  Recheck for new fever >100.5, increasing phlegm production/nasal discharge, or if not starting to improve in a few days.

## 2017-10-15 NOTE — ED Provider Notes (Signed)
[place]    CSN: [id number] Arrival date & time: 10/15/17  1246     History   Chief Complaint Chief Complaint  Patient presents with  . Cough    HPI Bryan Curtis is a 57 y.o. male.   Presents today with onset of cough, chest tightness, head congestion.  May be a Yablonski bit achy, maybe a Huseman bit of tactile temperature.  No headache.  No nausea/vomiting, no diarrhea.  History of asthma.  Smokes, has tried to quit.    HPI  Past Medical History:  Diagnosis Date  . MI, old     History reviewed. No pertinent surgical history.     Home Medications    Prior to Admission medications   Medication Sig Start Date End Date Taking? Authorizing Provider  albuterol (PROVENTIL HFA;VENTOLIN HFA) 108 (90 Base) MCG/ACT inhaler Inhale 1-2 puffs into the lungs every 4 (four) hours as needed for wheezing or shortness of breath. 10/15/17   Wynona Luna, MD  benzonatate (TESSALON) 200 MG capsule Take 1 capsule (200 mg total) by mouth 3 (three) times daily as needed for cough. 10/15/17   Wynona Luna, MD  predniSONE (DELTASONE) 50 MG tablet Take 1 tablet (50 mg total) by mouth daily. 10/15/17   Wynona Luna, MD    Family History History reviewed. No pertinent family history.  Social History Social History   Tobacco Use  . Smoking status: Current Every Day Smoker    Packs/day: 0.50    Types: Cigarettes  . Smokeless tobacco: Never Used  Substance Use Topics  . Alcohol use: Yes    Comment: occ  . Drug use: No     Allergies   Penicillins   Review of Systems Review of Systems  All other systems reviewed and are negative.    Physical Exam Triage Vital Signs ED Triage Vitals  Enc Vitals Group     BP 10/15/17 1326 122/84     Pulse Rate 10/15/17 1326 86     Resp 10/15/17 1326 16     Temp 10/15/17 1326 98.4 F (36.9 C)     Temp Source 10/15/17 1326 Oral     SpO2 10/15/17 1326 98 %     Weight --      Height --      Pain Score 10/15/17  1334 0     Pain Loc --    Updated Vital Signs BP 122/84 (BP Location: Left Arm)   Pulse 86   Temp 98.4 F (36.9 C) (Oral)   Resp 16   SpO2 98%  Physical Exam  Constitutional: He is oriented to person, place, and time. No distress.  Alert, nicely groomed  HENT:  Head: Atraumatic.  Bilateral TMs are translucent, no erythema Marked nasal congestion bilaterally, with mild excoriation and a lot of mucosal purulent material present. Posterior pharynx is injected with some cobblestoning and postnasal drainage evident  Eyes:  Conjugate gaze, no eye redness/drainage  Neck: Neck supple.  Cardiovascular: Normal rate and regular rhythm.  Pulmonary/Chest: No respiratory distress. He has no wheezes. He has no rales.  Coarse but symmetric breath sounds throughout  Abdominal: He exhibits no distension.  Musculoskeletal: Normal range of motion.  Neurological: He is alert and oriented to person, place, and time.  Skin: Skin is warm and dry.  No cyanosis  Nursing note and vitals reviewed.    UC Treatments / Results   EKG None Radiology No results found.  Procedures Procedures (including critical care  time) None today  Final Clinical Impressions(s) / UC Diagnoses   Final diagnoses:  Bronchitis with bronchospasm  Sinus congestion   Anticipate gradual improvement in cough/congestion over the next several days.  Prescriptions for prednisone (steroid, for cough, chest tightness, sinus congestion), benzonatate (for cough), and albuterol inhaler were sent to the pharmacy.  Note for work today.  Push fluids and rest.  Recheck for new fever >100.5, increasing phlegm production/nasal discharge, or if not starting to improve in a few days.     ED Discharge Orders        Ordered    predniSONE (DELTASONE) 50 MG tablet  Daily     10/15/17 1359    albuterol (PROVENTIL HFA;VENTOLIN HFA) 108 (90 Base) MCG/ACT inhaler  Every 4 hours PRN     10/15/17 1359    benzonatate (TESSALON) 200 MG capsule   3 times daily PRN     10/15/17 1359         Wynona Luna, MD 10/15/17 2038

## 2017-10-15 NOTE — ED Triage Notes (Signed)
Pt here for cough and congestion since yesterday.

## 2017-12-02 ENCOUNTER — Other Ambulatory Visit: Payer: Self-pay

## 2017-12-02 ENCOUNTER — Encounter (HOSPITAL_COMMUNITY): Payer: Self-pay | Admitting: *Deleted

## 2017-12-02 ENCOUNTER — Ambulatory Visit (HOSPITAL_COMMUNITY)
Admission: EM | Admit: 2017-12-02 | Discharge: 2017-12-02 | Disposition: A | Discharge status: Home / Self Care | Payer: Self-pay | Attending: Family Medicine | Admitting: Family Medicine

## 2017-12-02 DIAGNOSIS — L237 Allergic contact dermatitis due to plants, except food: Secondary | ICD-10-CM

## 2017-12-02 MED ORDER — PREDNISONE 50 MG PO TABS: 50.0000 mg | ORAL_TABLET | Freq: Every day | ORAL | 1 refills | Status: DC

## 2017-12-02 NOTE — ED Provider Notes (Signed)
[place]   [id number] 12/02/17 Arrival Time: 1208   SUBJECTIVE:  Bryan Curtis is a 57 y.o. male who presents to the urgent care with complaint of poison ivy on left arm and left leg after getting in the woods 10 days ago. The rest the next 7 days. He's used cortisone gel in the past.  The left leg is losing serous fluid Past Medical History:  Diagnosis Date  . MI, old    History reviewed. No pertinent family history. Social History   Socioeconomic History  . Marital status: Single    Spouse name: Not on file  . Number of children: Not on file  . Years of education: Not on file  . Highest education level: Not on file  Occupational History  . Not on file  Social Needs  . Financial resource strain: Not on file  . Food insecurity:    Worry: Not on file    Inability: Not on file  . Transportation needs:    Medical: Not on file    Non-medical: Not on file  Tobacco Use  . Smoking status: Current Every Day Smoker    Packs/day: 0.50    Types: Cigarettes  . Smokeless tobacco: Never Used  Substance and Sexual Activity  . Alcohol use: Yes    Comment: occ  . Drug use: No  . Sexual activity: Not on file  Lifestyle  . Physical activity:    Days per week: Not on file    Minutes per session: Not on file  . Stress: Not on file  Relationships  . Social connections:    Talks on phone: Not on file    Gets together: Not on file    Attends religious service: Not on file    Active member of club or organization: Not on file    Attends meetings of clubs or organizations: Not on file    Relationship status: Not on file  . Intimate partner violence:    Fear of current or ex partner: Not on file    Emotionally abused: Not on file    Physically abused: Not on file    Forced sexual activity: Not on file  Other Topics Concern  . Not on file  Social History Narrative  . Not on file   No outpatient medications have been marked as taking for the 12/02/17 encounter  [facility] Encounter).   Allergies  Allergen Reactions  . Penicillins Other (See Comments)    Childhood reaction       ROS: As per HPI, remainder of ROS negative.   OBJECTIVE:   Vitals:   12/02/17 1250  BP: 117/78  Pulse: 79  Temp: 98.2 F (36.8 C)  TempSrc: Oral  SpO2: 100%     General appearance: alert; no distress Eyes: PERRL; EOMI; conjunctiva normal HENT: normocephalic; atraumatic;  oral mucosa normal Neck: supple Back: no CVA tenderness Extremities: no cyanosis or edema; symmetrical with no gross deformities Skin: Eczematous rash in the left antecubital area with excoriations, left leg shows wet eczematous changes Neurologic: normal gait; grossly normal Psychological: alert and cooperative; normal mood and affect      Labs:  Results for orders placed or performed during the hospital encounter of 81/19/14  Basic metabolic panel  Result Value Ref Range   Sodium 136 135 - 145 mmol/L   Potassium 3.4 (L) 3.5 - 5.1 mmol/L   Chloride 102 101 - 111 mmol/L   CO2 27 22 - 32 mmol/L   Glucose, Bld  133 (H) 65 - 99 mg/dL   BUN 7 6 - 20 mg/dL   Creatinine, Ser 0.94 0.61 - 1.24 mg/dL   Calcium 9.0 8.9 - 10.3 mg/dL   GFR calc non Af Amer >60 >60 mL/min   GFR calc Af Amer >60 >60 mL/min   Anion gap 7 5 - 15  CBC  Result Value Ref Range   WBC 10.1 4.0 - 10.5 K/uL   RBC 5.44 4.22 - 5.81 MIL/uL   Hemoglobin 15.0 13.0 - 17.0 g/dL   HCT 44.2 39.0 - 52.0 %   MCV 81.3 78.0 - 100.0 fL   MCH 27.6 26.0 - 34.0 pg   MCHC 33.9 30.0 - 36.0 g/dL   RDW 15.3 11.5 - 15.5 %   Platelets 417 (H) 150 - 400 K/uL  I-stat troponin, ED  Result Value Ref Range   Troponin i, poc 0.00 0.00 - 0.08 ng/mL   Comment 3            Labs Reviewed - No data to display  No results found.     ASSESSMENT & PLAN:  1. Poison ivy     Meds ordered this encounter  Medications  . predniSONE (DELTASONE) 50 MG tablet    Sig: Take 1 tablet (50 mg total) by mouth daily.    Dispense:  7  tablet    Refill:  1    Reviewed expectations re: course of current medical issues. Questions answered. Outlined signs and symptoms indicating need for more acute intervention. Patient verbalized understanding. After Visit Summary given.    Procedures:      Robyn Haber, MD 12/02/17 1304

## 2017-12-02 NOTE — ED Triage Notes (Signed)
Rash on left arm, per pt he just need the gel cream

## 2017-12-19 ENCOUNTER — Ambulatory Visit (HOSPITAL_COMMUNITY)
Admission: EM | Admit: 2017-12-19 | Discharge: 2017-12-19 | Disposition: A | Discharge status: Home / Self Care | Payer: Self-pay | Attending: Family Medicine | Admitting: Family Medicine

## 2017-12-19 ENCOUNTER — Encounter (HOSPITAL_COMMUNITY): Payer: Self-pay | Admitting: Emergency Medicine

## 2017-12-19 DIAGNOSIS — L309 Dermatitis, unspecified: Secondary | ICD-10-CM

## 2017-12-19 MED ORDER — MUPIROCIN 2 % EX OINT: 1.0000 "application " | TOPICAL_OINTMENT | Freq: Two times a day (BID) | CUTANEOUS | 0 refills | Status: DC

## 2017-12-19 MED ORDER — TRIAMCINOLONE ACETONIDE 0.5 % EX OINT: 1.0000 "application " | TOPICAL_OINTMENT | Freq: Two times a day (BID) | CUTANEOUS | 0 refills | Status: DC

## 2017-12-19 NOTE — ED Triage Notes (Signed)
Pt was given a course and completed prednisone for a rash on his left calf.  The rash is not going away.

## 2017-12-19 NOTE — ED Provider Notes (Signed)
[place]   [id number] 12/19/17 Arrival Time: 6767  ASSESSMENT & PLAN:  1. Dermatitis   Still suspect rhus etiology.  Meds ordered this encounter  Medications  . triamcinolone ointment (KENALOG) 0.5 %    Sig: Apply 1 application topically 2 (two) times daily.    Dispense:  30 g    Refill:  0  . mupirocin ointment (BACTROBAN) 2 %    Sig: Apply 1 application topically 2 (two) times daily.    Dispense:  22 g    Refill:  0   Follow-up [place].   Specialty:  Urgent Care Why:  If symptoms worsen or if not improving over the next week. Contact information: [place] [phone]          Reviewed expectations re: course of current medical issues. Questions answered. Outlined signs and symptoms indicating need for more acute intervention. Patient verbalized understanding. After Visit Summary given.   SUBJECTIVE:  Zayquan Bogard Sirmons is a 57 y.o. male who presents with a skin complaint.   Location: inner lower L and R legs Onset: gradual Duration: 2 weeks Pruritic? Yes Painful? No Progression: stable  Drainage? weeping Known trigger? Questions poison ivy New soaps/lotions/topicals/detergents? No Contacts with similar? No Recent travel? No  Other associated symptoms: none Therapies tried thus far: took daily prednisone which helped with itching but rash still present Denies fever. No specific aggravating or alleviating factors reported.  Similar rash a couple of years ago. Steroid ointment helped significantly.  ROS: As per HPI.  OBJECTIVE: Vitals:   12/19/17 1125  BP: 126/83  Pulse: 78  Temp: 98.5 F (36.9 C)  TempSrc: Oral  SpO2: 96%    General appearance: alert; no distress Extremities: no edema Skin: warm and dry; confluent area of skin breakdown/irritation with papules and vesicles with surrounding erythema over lower and inner L and R legs;  slight clear weeping; no ulceration Psychological: alert and cooperative; normal mood and affect  Allergies  Allergen Reactions  . Penicillins Other (See Comments)    Childhood reaction     Past Medical History:  Diagnosis Date  . MI, old    Social History   Socioeconomic History  . Marital status: Single    Spouse name: Not on file  . Number of children: Not on file  . Years of education: Not on file  . Highest education level: Not on file  Occupational History  . Not on file  Social Needs  . Financial resource strain: Not on file  . Food insecurity:    Worry: Not on file    Inability: Not on file  . Transportation needs:    Medical: Not on file    Non-medical: Not on file  Tobacco Use  . Smoking status: Current Every Day Smoker    Packs/day: 0.50    Types: Cigarettes  . Smokeless tobacco: Never Used  Substance and Sexual Activity  . Alcohol use: Yes    Comment: occ  . Drug use: No  . Sexual activity: Not on file  Lifestyle  . Physical activity:    Days per week: Not on file    Minutes per session: Not on file  . Stress: Not on file  Relationships  . Social connections:    Talks on phone: Not on file    Gets together: Not on file    Attends religious service: Not on file  Active member of club or organization: Not on file    Attends meetings of clubs or organizations: Not on file    Relationship status: Not on file  . Intimate partner violence:    Fear of current or ex partner: Not on file    Emotionally abused: Not on file    Physically abused: Not on file    Forced sexual activity: Not on file  Other Topics Concern  . Not on file  Social History Narrative  . Not on file   History reviewed. No pertinent family history. History reviewed. No pertinent surgical history.   Vanessa Kick, MD 12/19/17 1212

## 2018-03-04 ENCOUNTER — Ambulatory Visit (HOSPITAL_COMMUNITY)
Admission: EM | Admit: 2018-03-04 | Discharge: 2018-03-04 | Disposition: A | Discharge status: Home / Self Care | Payer: Self-pay | Attending: Family Medicine | Admitting: Family Medicine

## 2018-03-04 ENCOUNTER — Encounter (HOSPITAL_COMMUNITY): Payer: Self-pay | Admitting: Emergency Medicine

## 2018-03-04 DIAGNOSIS — K529 Noninfective gastroenteritis and colitis, unspecified: Secondary | ICD-10-CM

## 2018-03-04 MED ORDER — ONDANSETRON 4 MG PO TBDP: 4.0000 mg | ORAL_TABLET | Freq: Three times a day (TID) | ORAL | 0 refills | Status: DC | PRN

## 2018-03-04 NOTE — ED Provider Notes (Signed)
[place]    CSN: [id number] Arrival date & time: 03/04/18  1017     History   Chief Complaint Chief Complaint  Patient presents with  . Abdominal Pain    HPI Bryan Curtis is a 57 y.o. male.   Bryan Curtis presents with complaints of onset of abdominal pain, vomiting and diarrhea yesterday morning. States he had eaten left over [place] the [place]. No others ill. No longer with abdominal pain. No further diarrhea or vomiting today. Nausea has resolved. No dizziness, lightheadedness, headache or fevers. No blood in stool or emesis. Urinating normally. Has been drinking fluids, has not yet eaten anything. Without contributing medical history.    ROS per HPI.      Past Medical History:  Diagnosis Date  . MI, old     There are no active problems to display for this patient.   History reviewed. No pertinent surgical history.     Home Medications    Prior to Admission medications   Medication Sig Start Date End Date Taking? Authorizing Provider  albuterol (PROVENTIL HFA;VENTOLIN HFA) 108 (90 Base) MCG/ACT inhaler Inhale 1-2 puffs into the lungs every 4 (four) hours as needed for wheezing or shortness of breath. 10/15/17   Wynona Luna, MD  benzonatate (TESSALON) 200 MG capsule Take 1 capsule (200 mg total) by mouth 3 (three) times daily as needed for cough. Patient not taking: Reported on 03/04/2018 10/15/17   Wynona Luna, MD  mupirocin ointment (BACTROBAN) 2 % Apply 1 application topically 2 (two) times daily. 12/19/17   Vanessa Kick, MD  ondansetron (ZOFRAN-ODT) 4 MG disintegrating tablet Take 1 tablet (4 mg total) by mouth every 8 (eight) hours as needed for nausea or vomiting. 03/04/18   Augusto Gamble B, NP  triamcinolone ointment (KENALOG) 0.5 % Apply 1 application topically 2 (two) times daily. 12/19/17   Vanessa Kick, MD    Family History History reviewed. No pertinent family history.  Social History Social History    Tobacco Use  . Smoking status: Current Every Day Smoker    Packs/day: 0.50    Types: Cigarettes  . Smokeless tobacco: Never Used  Substance Use Topics  . Alcohol use: Yes    Comment: occ  . Drug use: No     Allergies   Penicillins   Review of Systems Review of Systems   Physical Exam Triage Vital Signs ED Triage Vitals [03/04/18 1030]  Enc Vitals Group     BP (!) 135/91     Pulse Rate 85     Resp 16     Temp 98.1 F (36.7 C)     Temp src      SpO2 99 %     Weight      Height      Head Circumference      Peak Flow      Pain Score      Pain Loc      Pain Edu?      Excl. in [place]?    No data found.  Updated Vital Signs BP (!) 135/91 (BP Location: Left Arm)   Pulse 85   Temp 98.1 F (36.7 C)   Resp 16   SpO2 99%   Visual Acuity Right Eye Distance:   Left Eye Distance:   Bilateral Distance:    Right Eye Near:   Left Eye Near:    Bilateral Near:     Physical Exam  Constitutional: He is oriented to  person, place, and time. He appears well-developed and well-nourished.  Cardiovascular: Normal rate and regular rhythm.  Pulmonary/Chest: Effort normal and breath sounds normal.  Abdominal: Soft. Bowel sounds are normal. There is no tenderness. There is no rigidity, no rebound, no guarding, no CVA tenderness, no tenderness at McBurney's point and negative Murphy's sign.  Neurological: He is alert and oriented to person, place, and time.  Skin: Skin is warm and dry.     UC Treatments / Results  Labs (all labs ordered are listed, but only abnormal results are displayed) Labs Reviewed - No data to display  EKG None  Radiology No results found.  Procedures Procedures (including critical care time)  Medications Ordered in UC Medications - No data to display  Initial Impression / Assessment and Plan / UC Course  I have reviewed the triage vital signs and the nursing notes.  Pertinent labs & imaging results that were available during my care of  the patient were reviewed by me and considered in my medical decision making (see chart for details).     Non toxic in appearance. Afebrile. Without acute findings on exam, benign abdominal exam. No further emesis or diarrhea, taking fluids. Vitals stable. History and physical consistent with viral illness.  Expect continued improvement over the next 48-72 hours. Return precautions provided. zofran as needed. Fluids, bland diet. Patient verbalized understanding and agreeable to plan.   Final Clinical Impressions(s) / UC Diagnoses   Final diagnoses:  Gastroenteritis     Discharge Instructions     Small frequent sips of fluids- Pedialyte, Gatorade, water, broth- to maintain hydration.   Zofran as needed for nausea.  Advance to bland diet as tolerated.  If develop increased abdominal pain, dehydration, blood in stool or vomit, if symptoms persist greater than 1 week, or if otherwise worsening please return here or go to the Er.    ED Prescriptions    Medication Sig Dispense Auth. Provider   ondansetron (ZOFRAN-ODT) 4 MG disintegrating tablet Take 1 tablet (4 mg total) by mouth every 8 (eight) hours as needed for nausea or vomiting. 12 tablet Zigmund Gottron, NP     Controlled Substance Prescriptions [place] Controlled Substance Registry consulted? Not Applicable   Zigmund Gottron, NP 03/04/18 1057

## 2018-03-04 NOTE — ED Triage Notes (Signed)
Pt here for vomiting x 2 yesterday; pt sts some improvement today

## 2018-03-04 NOTE — Discharge Instructions (Signed)
Small frequent sips of fluids- Pedialyte, Gatorade, water, broth- to maintain hydration.   Zofran as needed for nausea.  Advance to bland diet as tolerated.  If develop increased abdominal pain, dehydration, blood in stool or vomit, if symptoms persist greater than 1 week, or if otherwise worsening please return here or go to the Er.

## 2018-05-14 ENCOUNTER — Encounter (HOSPITAL_COMMUNITY): Payer: Self-pay | Admitting: Emergency Medicine

## 2018-05-14 ENCOUNTER — Ambulatory Visit (HOSPITAL_COMMUNITY)
Admission: EM | Admit: 2018-05-14 | Discharge: 2018-05-14 | Disposition: A | Discharge status: Home / Self Care | Payer: Self-pay | Attending: Family Medicine | Admitting: Family Medicine

## 2018-05-14 DIAGNOSIS — L739 Follicular disorder, unspecified: Secondary | ICD-10-CM

## 2018-05-14 MED ORDER — DOXYCYCLINE HYCLATE 100 MG PO TABS: 100.0000 mg | ORAL_TABLET | Freq: Two times a day (BID) | ORAL | 0 refills | Status: DC

## 2018-05-14 MED ORDER — PREDNISONE 50 MG PO TABS: ORAL_TABLET | ORAL | 0 refills | Status: DC

## 2018-05-14 MED ORDER — TRIAMCINOLONE ACETONIDE 0.5 % EX OINT: 1.0000 "application " | TOPICAL_OINTMENT | Freq: Two times a day (BID) | CUTANEOUS | 1 refills | Status: DC

## 2018-05-14 NOTE — ED Triage Notes (Signed)
Pt sts itchy rash that is intermittent has returned

## 2018-05-14 NOTE — ED Provider Notes (Signed)
[place]    CSN: [id number] Arrival date & time: 05/14/18  1014     History   Chief Complaint Chief Complaint  Patient presents with  . Rash    HPI Bryan Curtis is a 57 y.o. male.   Established.  Recurrent itchy rash.  Patient has been diagnosed with poison ivy twice this year, the most recent visit was June.    He has multiple discrete papules on the inner thighs and down the legs as well as on the antecubital areas.  There is no interdigital contribution  Patient works at [employer]     Past Medical History:  Diagnosis Date  . MI, old     There are no active problems to display for this patient.   History reviewed. No pertinent surgical history.     Home Medications    Prior to Admission medications   Medication Sig Start Date End Date Taking? Authorizing Provider  albuterol (PROVENTIL HFA;VENTOLIN HFA) 108 (90 Base) MCG/ACT inhaler Inhale 1-2 puffs into the lungs every 4 (four) hours as needed for wheezing or shortness of breath. 10/15/17   Wynona Luna, MD  doxycycline (VIBRA-TABS) 100 MG tablet Take 1 tablet (100 mg total) by mouth 2 (two) times daily. 05/14/18   Robyn Haber, MD  predniSONE (DELTASONE) 50 MG tablet One daily 05/14/18   Robyn Haber, MD  triamcinolone ointment (KENALOG) 0.5 % Apply 1 application topically 2 (two) times daily. 05/14/18   Robyn Haber, MD    Family History History reviewed. No pertinent family history.  Social History Social History   Tobacco Use  . Smoking status: Current Every Day Smoker    Packs/day: 0.50    Types: Cigarettes  . Smokeless tobacco: Never Used  Substance Use Topics  . Alcohol use: Yes    Comment: occ  . Drug use: No     Allergies   Penicillins   Review of Systems Review of Systems   Physical Exam Triage Vital Signs ED Triage Vitals [05/14/18 1047]  Enc Vitals Group     BP 122/73     Pulse Rate 68     Resp 18     Temp 98.1 F (36.7 C)     Temp  Source Oral     SpO2 98 %     Weight      Height      Head Circumference      Peak Flow      Pain Score 3     Pain Loc      Pain Edu?      Excl. in [place]?    No data found.  Updated Vital Signs BP 122/73 (BP Location: Left Arm)   Pulse 68   Temp 98.1 F (36.7 C) (Oral)   Resp 18   SpO2 98%   Physical Exam  Constitutional: He is oriented to person, place, and time. He appears well-developed and well-nourished.  HENT:  Right Ear: External ear normal.  Left Ear: External ear normal.  Mouth/Throat: Oropharynx is clear and moist.  Eyes: Conjunctivae are normal.  Neck: Normal range of motion. Neck supple.  Pulmonary/Chest: Effort normal.  Musculoskeletal: Normal range of motion.  Neurological: He is alert and oriented to person, place, and time.  Skin:  Audible discrete papules on inner thighs and anterior shins as well as antecubital area.  Nursing note and vitals reviewed.    UC Treatments / Results  Labs (all labs ordered are listed, but only abnormal  results are displayed) Labs Reviewed - No data to display  EKG None  Radiology No results found.  Procedures Procedures (including critical care time)  Medications Ordered in UC Medications - No data to display  Initial Impression / Assessment and Plan / UC Course  I have reviewed the triage vital signs and the nursing notes.  Pertinent labs & imaging results that were available during my care of the patient were reviewed by me and considered in my medical decision making (see chart for details).    Final Clinical Impressions(s) / UC Diagnoses   Final diagnoses:  Folliculitis   Discharge Instructions   None    ED Prescriptions    Medication Sig Dispense Auth. Provider   triamcinolone ointment (KENALOG) 0.5 % Apply 1 application topically 2 (two) times daily. 90 g Robyn Haber, MD   predniSONE (DELTASONE) 50 MG tablet One daily 5 tablet Robyn Haber, MD   doxycycline (VIBRA-TABS) 100 MG tablet  Take 1 tablet (100 mg total) by mouth 2 (two) times daily. 20 tablet Robyn Haber, MD     Controlled Substance Prescriptions [place] Controlled Substance Registry consulted? No   Robyn Haber, MD 05/14/18 [phone]

## 2019-05-02 ENCOUNTER — Other Ambulatory Visit: Payer: Self-pay

## 2019-05-02 ENCOUNTER — Ambulatory Visit (HOSPITAL_COMMUNITY): Admission: EM | Admit: 2019-05-02 | Discharge: 2019-05-02 | Discharge status: Against Medical Advice | Payer: Self-pay

## 2019-09-03 ENCOUNTER — Emergency Department (HOSPITAL_COMMUNITY)
Admission: EM | Admit: 2019-09-03 | Discharge: 2019-09-03 | Disposition: A | Discharge status: Home / Self Care | Payer: Self-pay | Attending: Emergency Medicine | Admitting: Emergency Medicine

## 2019-09-03 ENCOUNTER — Emergency Department (HOSPITAL_COMMUNITY): Payer: Self-pay

## 2019-09-03 ENCOUNTER — Encounter (HOSPITAL_COMMUNITY): Payer: Self-pay | Admitting: Emergency Medicine

## 2019-09-03 ENCOUNTER — Other Ambulatory Visit: Payer: Self-pay

## 2019-09-03 DIAGNOSIS — F17210 Nicotine dependence, cigarettes, uncomplicated: Secondary | ICD-10-CM | POA: Insufficient documentation

## 2019-09-03 DIAGNOSIS — R55 Syncope and collapse: Secondary | ICD-10-CM | POA: Insufficient documentation

## 2019-09-03 DIAGNOSIS — R0789 Other chest pain: Secondary | ICD-10-CM | POA: Insufficient documentation

## 2019-09-03 LAB — COMPREHENSIVE METABOLIC PANEL
ALT: 21 U/L (ref 0–44)
AST: 26 U/L (ref 15–41)
Albumin: 3.5 g/dL (ref 3.5–5.0)
Alkaline Phosphatase: 62 U/L (ref 38–126)
Anion gap: 12 (ref 5–15)
BUN: 10 mg/dL (ref 6–20)
CO2: 23 mmol/L (ref 22–32)
Calcium: 8.7 mg/dL — ABNORMAL LOW (ref 8.9–10.3)
Chloride: 105 mmol/L (ref 98–111)
Creatinine, Ser: 1.04 mg/dL (ref 0.61–1.24)
GFR calc Af Amer: >60 mL/min (ref >60)
GFR calc non Af Amer: >60 mL/min (ref >60)
Glucose, Bld: 96 mg/dL (ref 70–99)
Potassium: 3.5 mmol/L (ref 3.5–5.1)
Sodium: 140 mmol/L (ref 135–145)
Total Bilirubin: 0.6 mg/dL (ref 0.3–1.2)
Total Protein: 6.9 g/dL (ref 6.5–8.1)

## 2019-09-03 LAB — URINALYSIS, ROUTINE W REFLEX MICROSCOPIC
Bilirubin Urine: NEGATIVE
Glucose, UA: NEGATIVE mg/dL
Hgb urine dipstick: NEGATIVE
Ketones, ur: 5 mg/dL — AB
Leukocytes,Ua: NEGATIVE
Nitrite: NEGATIVE
Protein, ur: NEGATIVE mg/dL
Specific Gravity, Urine: 1.013 (ref 1.005–1.030)
pH: 5 (ref 5.0–8.0)

## 2019-09-03 LAB — CBC WITH DIFFERENTIAL/PLATELET
Abs Immature Granulocytes: 0.03 10*3/uL (ref 0.00–0.07)
Basophils Absolute: 0.1 10*3/uL (ref 0.0–0.1)
Basophils Relative: 1 %
Eosinophils Absolute: 0.2 10*3/uL (ref 0.0–0.5)
Eosinophils Relative: 4 %
HCT: 48.5 % (ref 39.0–52.0)
Hemoglobin: 15.6 g/dL (ref 13.0–17.0)
Immature Granulocytes: 1 %
Lymphocytes Relative: 35 %
Lymphs Abs: 1.8 10*3/uL (ref 0.7–4.0)
MCH: 27.5 pg (ref 26.0–34.0)
MCHC: 32.2 g/dL (ref 30.0–36.0)
MCV: 85.5 fL (ref 80.0–100.0)
Monocytes Absolute: 0.7 10*3/uL (ref 0.1–1.0)
Monocytes Relative: 13 %
Neutro Abs: 2.4 10*3/uL (ref 1.7–7.7)
Neutrophils Relative %: 46 %
Platelets: 392 10*3/uL (ref 150–400)
RBC: 5.67 MIL/uL (ref 4.22–5.81)
RDW: 15.6 % — ABNORMAL HIGH (ref 11.5–15.5)
WBC: 5.1 10*3/uL (ref 4.0–10.5)
nRBC: 0 % (ref 0.0–0.2)

## 2019-09-03 LAB — TROPONIN I (HIGH SENSITIVITY)
Troponin I (High Sensitivity): 3 ng/L (ref <18)
Troponin I (High Sensitivity): <2 ng/L (ref <18)

## 2019-09-03 MED ORDER — IOHEXOL 350 MG/ML SOLN
100.0000 mL | Freq: Once | INTRAVENOUS | Status: AC | PRN
  Administered 2019-09-03: 13:00:00 100 mL via INTRAVENOUS

## 2019-09-03 MED ORDER — SODIUM CHLORIDE 0.9 % IV BOLUS
1000.0000 mL | Freq: Once | INTRAVENOUS | Status: AC
  Administered 2019-09-03: 1000 mL via INTRAVENOUS

## 2019-09-03 NOTE — ED Triage Notes (Signed)
Pt arrives via [facility], ems reports patient was on [place] [employer] [place] this [place] when he had a syncopal episode witnessed by the bus driver. The driver reported pt became alert immediately after episode. Upon EMS arrival, pt was a/ox4, bp 70/42 with improvement to 108/76 after 500 cc NS. CBG 106, O2 96% on RA. Pt reports hx of the same a few years ago but never saw a doctor in regards to it. Pt has no other complaints at this time.

## 2019-09-03 NOTE — Discharge Planning (Signed)
Ashten Prats J. Clydene Laming, RN, BSN, [place] [phone]  [facility] set up appointment with [facility] on 3/11@3 :41.  Spoke with pt at bedside and advised to please arrive 15 min early and take a picture ID and your current medications.  Pt verbalizes understanding of keeping appointment.

## 2019-09-03 NOTE — ED Notes (Signed)
Patient verbalized understanding of dc instructions, vss, ambulatory with nad.   

## 2019-09-03 NOTE — Discharge Instructions (Signed)
Return here as needed.  Follow-up with the appointment with the primary care doctor.  If your symptoms recur or you have further issues return here for reevaluation.

## 2019-09-03 NOTE — ED Provider Notes (Signed)
[facility] EMERGENCY DEPARTMENT Provider Note   CSN: [id number] Arrival date & time: 09/03/19  [id number]     History Chief Complaint  Patient presents with  . Loss of Consciousness    Bryan Curtis is a 59 y.o. male.  HPI Patient presents to the emergency department with syncopal episode that occurred while riding the bus to work.  The patient states he had not eaten today.  The patient states that he had had this happen one time in the past but had been quite a while.  Patient states he had no prodromal symptoms of dizziness weakness chest pain or shortness of breath.  The patient denies chest pain, shortness of breath, headache,blurred vision, neck pain, fever, cough, weakness, numbness, dizziness, anorexia, edema, abdominal pain, nausea, vomiting, diarrhea, rash, back pain, dysuria, hematemesis, bloody stool.     Past Medical History:  Diagnosis Date  . MI, old     There are no problems to display for this patient.   History reviewed. No pertinent surgical history.     No family history on file.  Social History   Tobacco Use  . Smoking status: Current Every Day Smoker    Packs/day: 0.50    Types: Cigarettes  . Smokeless tobacco: Never Used  Substance Use Topics  . Alcohol use: Yes    Comment: occ  . Drug use: No    Home Medications Prior to Admission medications   Not on File    Allergies    Penicillins  Review of Systems   Review of Systems All other systems negative except as documented in the HPI. All pertinent positives and negatives as reviewed in the HPI. Physical Exam Updated Vital Signs BP 122/76   Pulse 87   Temp 97.7 F (36.5 C) (Oral)   Resp 18   SpO2 99%   Physical Exam Vitals and nursing note reviewed.  Constitutional:      General: He is not in acute distress.    Appearance: He is well-developed.  HENT:     Head: Normocephalic and atraumatic.  Eyes:     Pupils: Pupils are equal, round, and reactive to light.    Cardiovascular:     Rate and Rhythm: Normal rate and regular rhythm.     Heart sounds: Normal heart sounds. No murmur. No friction rub. No gallop.   Pulmonary:     Effort: Pulmonary effort is normal. No respiratory distress.     Breath sounds: Normal breath sounds. No wheezing.  Abdominal:     General: Bowel sounds are normal. There is no distension.     Palpations: Abdomen is soft.     Tenderness: There is no abdominal tenderness.  Musculoskeletal:     Cervical back: Normal range of motion and neck supple.  Skin:    General: Skin is warm and dry.     Capillary Refill: Capillary refill takes less than 2 seconds.     Findings: No erythema or rash.  Neurological:     Mental Status: He is alert and oriented to person, place, and time.     Motor: No abnormal muscle tone.     Coordination: Coordination normal.  Psychiatric:        Behavior: Behavior normal.     ED Results / Procedures / Treatments   Labs (all labs ordered are listed, but only abnormal results are displayed) Labs Reviewed  COMPREHENSIVE METABOLIC PANEL - Abnormal; Notable for the following components:      Result Value  Calcium 8.7 (*)    All other components within normal limits  CBC WITH DIFFERENTIAL/PLATELET - Abnormal; Notable for the following components:   RDW 15.6 (*)    All other components within normal limits  URINALYSIS, ROUTINE W REFLEX MICROSCOPIC - Abnormal; Notable for the following components:   Ketones, ur 5 (*)    All other components within normal limits  TROPONIN I (HIGH SENSITIVITY)  TROPONIN I (HIGH SENSITIVITY)    EKG None  Radiology DG Chest 2 View  Result Date: 09/03/2019 CLINICAL DATA:  Syncopal episode on the bus this morning EXAM: CHEST - 2 VIEW COMPARISON:  05/29/2017 FINDINGS: Normal heart size, mediastinal contours, and pulmonary vascularity. Lungs clear. No pulmonary infiltrate, pleural effusion or pneumothorax. Osseous structures unremarkable. IMPRESSION: No acute  abnormalities. Electronically Signed   By: Lavonia Dana M.D.   On: 09/03/2019 08:11   CT Angio Chest PE W/Cm &/Or Wo Cm  Result Date: 09/03/2019 CLINICAL DATA:  Syncope EXAM: CT ANGIOGRAPHY CHEST WITH CONTRAST TECHNIQUE: Multidetector CT imaging of the chest was performed using the standard protocol during bolus administration of intravenous contrast. Multiplanar CT image reconstructions and MIPs were obtained to evaluate the vascular anatomy. CONTRAST:  65 mL OMNIPAQUE IOHEXOL 350 MG/ML SOLN COMPARISON:  Chest radiograph September 03, 2019 FINDINGS: Cardiovascular: There is no demonstrable pulmonary embolus. No thoracic aortic aneurysm. No dissection evident. Note that the contrast bolus in the aorta is somewhat less than optimal for dissection assessment. Visualized great vessels appear unremarkable. Note that the right innominate and left common carotid arteries arise as a common trunk, an anatomic variant. No pericardial effusion or pericardial thickening evident. Mediastinum/Nodes: Thyroid appears unremarkable. There is no evident thoracic adenopathy. No esophageal lesions are appreciable. Lungs/Pleura: There are small bullae in the apices. There is atelectatic change in the posterior lung bases. There is no edema or airspace opacity. No pleural effusions are evident. On axial slice 53 series 7, there is a 4 mm nodular opacity in the medial segment of the right middle lobe. Upper Abdomen: Visualized upper abdominal structures appear unremarkable. Musculoskeletal: No blastic or lytic bone lesions. No evident chest wall lesions. Review of the MIP images confirms the above findings. IMPRESSION: 1. No demonstrable pulmonary embolus. No thoracic aortic aneurysm. No dissection evident. Contrast bolus in the aorta is less than optimal for dissection assessment. 2. Areas of mild atelectatic change. No edema or airspace opacity. 4 mm nodular opacity noted in right middle lobe. No follow-up needed if patient is  low-risk. Non-contrast chest CT can be considered in 12 months if patient is high-risk. This recommendation follows the consensus statement: Guidelines for Management of Incidental Pulmonary Nodules Detected on CT Images: From the Fleischner Society 2017; Radiology 2017; 284:228-243. 3.  No evident adenopathy. Electronically Signed   By: Lowella Grip III M.D.   On: 09/03/2019 13:53    Procedures Procedures (including critical care time)  Medications Ordered in ED Medications  sodium chloride 0.9 % bolus 1,000 mL (0 mLs Intravenous Stopped 09/03/19 1133)  iohexol (OMNIPAQUE) 350 MG/ML injection 100 mL (100 mLs Intravenous Contrast Given 09/03/19 1325)    ED Course  I have reviewed the triage vital signs and the nursing notes.  Pertinent labs & imaging results that were available during my care of the patient were reviewed by me and considered in my medical decision making (see chart for details).  Clinical Course as of Sep 03 1427  Wed Sep 03, 2019  1021 Troponin I (High Sensitivity): 3 [ES]  1021 HCT: 48.5 [ES]    Clinical Course User Index [ES] Malena Catholic, Talitha Givens   MDM Rules/Calculators/A&P                      Patient's laboratory testing is not any significant abnormalities.  The patient had CT scan imaging of his chest to the fact that he did get tachycardic while ambulating but did not desaturate.  The patient vital signs have dramatically improved following fluids and having him eat.  The patient may have some level of dehydration that contributed to this.  The patient is feeling better and had no symptoms while ambulating.  The patient will be discharged home we did get him an appoint with a primary care doctor. Final Clinical Impression(s) / ED Diagnoses Final diagnoses:  None    Rx / DC Orders ED Discharge Orders    None       Rebeca Allegra 09/03/19 1439    Davonna Belling, MD 09/03/19 1500

## 2019-09-18 ENCOUNTER — Encounter (INDEPENDENT_AMBULATORY_CARE_PROVIDER_SITE_OTHER): Payer: Self-pay | Admitting: Primary Care

## 2019-09-18 ENCOUNTER — Ambulatory Visit (INDEPENDENT_AMBULATORY_CARE_PROVIDER_SITE_OTHER): Payer: Medicaid Other | Admitting: Primary Care

## 2019-09-18 VITALS — BP 123/66 | HR 84 | Ht 70.0 in | Wt 153.0 lb

## 2019-09-18 DIAGNOSIS — Z13220 Encounter for screening for lipoid disorders: Secondary | ICD-10-CM

## 2019-09-18 DIAGNOSIS — Z114 Encounter for screening for human immunodeficiency virus [HIV]: Secondary | ICD-10-CM

## 2019-09-18 DIAGNOSIS — M7051 Other bursitis of knee, right knee: Secondary | ICD-10-CM

## 2019-09-18 DIAGNOSIS — Z23 Encounter for immunization: Secondary | ICD-10-CM

## 2019-09-18 DIAGNOSIS — R55 Syncope and collapse: Secondary | ICD-10-CM

## 2019-09-18 DIAGNOSIS — Z1159 Encounter for screening for other viral diseases: Secondary | ICD-10-CM

## 2019-09-18 DIAGNOSIS — F17200 Nicotine dependence, unspecified, uncomplicated: Secondary | ICD-10-CM

## 2019-09-18 DIAGNOSIS — Z1211 Encounter for screening for malignant neoplasm of colon: Secondary | ICD-10-CM

## 2019-09-18 DIAGNOSIS — M7052 Other bursitis of knee, left knee: Secondary | ICD-10-CM

## 2019-09-18 DIAGNOSIS — Z09 Encounter for follow-up examination after completed treatment for conditions other than malignant neoplasm: Secondary | ICD-10-CM

## 2019-09-18 DIAGNOSIS — Z7689 Persons encountering health services in other specified circumstances: Secondary | ICD-10-CM

## 2019-09-18 NOTE — Patient Instructions (Signed)
Bursitis  Bursitis is when the fluid-filled sac (bursa) that covers and protects a joint is swollen (inflamed). Bursitis is most common near joints such as the knees, elbows, hips, and shoulders. It can cause pain and stiffness. Follow these instructions at home: Medicines  Take over-the-counter and prescription medicines only as told by your doctor.  If you were prescribed an antibiotic medicine, take it as told by your doctor. Do not stop taking it even if you start to feel better. General instructions   Rest the affected area as told by your doctor. ? If you can, raise (elevate) the affected area above the level of your heart while you are sitting or lying down. ? Avoid doing things that make the pain worse.  Use a splint, brace, pad, or walking aid as told by your doctor.  If directed, put ice on the affected area: ? If you have a removable splint or brace, take it off as told by your doctor. ? Put ice in a plastic bag. ? Place a towel between your skin and the bag, or between the splint or brace and the bag. ? Leave the ice on for 20 minutes, 2-3 times a day.  Keep all follow-up visits as told by your doctor. This is important. Preventing symptoms Do these things to help you not have symptoms again:  Wear knee pads if you kneel often.  Wear sturdy running or walking shoes that fit you well.  Take a lot of breaks during activities that involve doing the same movements again and again.  Before you do any activity that takes a lot of effort, get your body ready by stretching.  Stay at a healthy weight or lose weight if your doctor says you should. If you need help doing this, ask your doctor.  Exercise often. If you start any new physical activity, do it slowly. Contact a doctor if you:  Have a fever.  Have chills.  Have symptoms that do not get better with treatment or home care. Summary  Bursitis is when the fluid-filled sac (bursa) that covers and protects a joint  is swollen.  Rest the affected area as told by your doctor.  Avoid doing things that make the pain worse.  Put ice on the affected area as told by your doctor. This information is not intended to replace advice given to you by your health care provider. Make sure you discuss any questions you have with your health care provider. Document Revised: 06/08/2017 Document Reviewed: 05/11/2017 Elsevier Patient Education  2020 Elsevier Inc.  

## 2019-09-18 NOTE — Progress Notes (Signed)
New Patient Office Visit  Subjective:  Patient ID: Bryan Curtis, male    DOB: 1961-05-01  Age: 59 y.o. MRN: [id number]  CC:  Chief Complaint  Patient presents with  . New Patient (Initial Visit)    Pt. is here to establish care for HTN.     HPI Bryan Curtis presents for establishment of care and hospital follow up. Major concern is syncope episodes . This happens in the morning on the way to work no food. Probably hypoglycemia. Voiced a knott below his knee for 2 years (bursitis). No other complains or concerns.  Past Medical History:  Diagnosis Date  . MI, old     Past Surgical History:  Procedure Laterality Date  . NO PAST SURGERIES      Family History  Problem Relation Age of Onset  . Diabetes Neg Hx     Social History   Socioeconomic History  . Marital status: Single    Spouse name: Not on file  . Number of children: Not on file  . Years of education: Not on file  . Highest education level: Not on file  Occupational History  . Not on file  Tobacco Use  . Smoking status: Current Every Day Smoker    Packs/day: 0.50    Types: Cigarettes  . Smokeless tobacco: Never Used  Substance and Sexual Activity  . Alcohol use: Yes    Comment: occ  . Drug use: Yes    Types: Marijuana  . Sexual activity: Not Currently  Other Topics Concern  . Not on file  Social History Narrative  . Not on file   Social Determinants of Health   Financial Resource Strain:   . Difficulty of Paying Living Expenses:   Food Insecurity:   . Worried About Charity fundraiser in the Last Year:   . Arboriculturist in the Last Year:   Transportation Needs:   . Film/video editor (Medical):   Marland Kitchen Lack of Transportation (Non-Medical):   Physical Activity:   . Days of Exercise per Week:   . Minutes of Exercise per Session:   Stress:   . Feeling of Stress :   Social Connections:   . Frequency of Communication with Friends and Family:   . Frequency of Social Gatherings with  Friends and Family:   . Attends Religious Services:   . Active Member of Clubs or Organizations:   . Attends Archivist Meetings:   Marland Kitchen Marital Status:   Intimate Partner Violence:   . Fear of Current or Ex-Partner:   . Emotionally Abused:   Marland Kitchen Physically Abused:   . Sexually Abused:     ROS Review of Systems  Musculoskeletal:       Right leg below knee . Cyst leg swells around area  Neurological: Positive for syncope.       Probably due to hypoglycemia -events happen on a empty stomach the following day  All other systems reviewed and are negative.   Objective:   Today's Vitals: BP 123/66 (BP Location: Right Arm, Patient Position: Sitting, Cuff Size: Normal)   Pulse 84   Ht 5\' 10"  (1.778 m)   Wt 153 lb (69.4 kg)   SpO2 96%   BMI 21.95 kg/m   Physical Exam Vitals reviewed.  Constitutional:      Appearance: Normal appearance.  HENT:     Head: Normocephalic.     Right Ear: Tympanic membrane normal.     Left Ear: Tympanic  membrane normal.     Nose: Nose normal.  Eyes:     Extraocular Movements: Extraocular movements intact.     Pupils: Pupils are equal, round, and reactive to light.  Cardiovascular:     Rate and Rhythm: Normal rate and regular rhythm.     Pulses: Normal pulses.  Pulmonary:     Effort: Pulmonary effort is normal.     Breath sounds: Normal breath sounds.  Abdominal:     General: Bowel sounds are normal.  Musculoskeletal:        General: Normal range of motion.     Cervical back: Normal range of motion and neck supple.  Skin:    General: Skin is warm and dry.  Neurological:     Mental Status: He is alert and oriented to person, place, and time.  Psychiatric:        Mood and Affect: Mood normal.        Behavior: Behavior normal.        Thought Content: Thought content normal.        Judgment: Judgment normal.     Assessment & Plan:  [name] was seen today for new patient (initial visit).  Diagnoses and all orders for this  visit:  Encounter to establish care [name], NP-C will be your  (PCP) she is mastered prepared . She is skilled to diagnosed and treat illness. Also able to answer health concern as well as continuing care of varied medical conditions, not limited by cause, organ system, or diagnosis.   Tobacco dependence Cutting down smoking 2-3 cigarettes daily . He is aware of   increased risk for lung cancer and other respiratory diseases recommend cessation.  This will be reminded at each clinical visit.   Hospital discharge follow-up Presented to the emergency department with syncopal episode that occurred while riding the bus to work.  He had not eaten today. Review was negative   Syncope, unspecified syncope type Probably secondary to hypoglycemia- needs to eat prior to work or leaving house.   Lipid screening -     Lipid Panel  Encounter for screening for HIV Health maintenance quality metrics -     HIV Antibody (routine testing w rflx)  Encounter for hepatitis C screening test for low risk patient Health maintenance quality metrics -     Hepatitis C Antibody  Colon cancer screening  Normal colon cancer screening.  CDC recommends colorectal screening from ages 66-75. -     Fecal occult blood, imunochemical; Future  Bursitis of both knees, unspecified bursa Bursitis is when the fluid-filled sac (bursa) that covers and protects a joint is swollen. -     Uric Acid  Other orders -     Tdap vaccine greater than or equal to 7yo IM     No outpatient encounter medications on file as of 09/18/2019.   No facility-administered encounter medications on file as of 09/18/2019.    Follow-up: Return if symptoms worsen or fail to improve.   Kerin Perna, NP

## 2019-09-19 LAB — LIPID PANEL
Chol/HDL Ratio: 2.2 ratio (ref 0.0–5.0)
Cholesterol, Total: 163 mg/dL (ref 100–199)
HDL: 73 mg/dL (ref >39)
LDL Chol Calc (NIH): 73 mg/dL (ref 0–99)
Triglycerides: 95 mg/dL (ref 0–149)
VLDL Cholesterol Cal: 17 mg/dL (ref 5–40)

## 2019-09-19 LAB — URIC ACID: Uric Acid: 5.3 mg/dL (ref 3.8–8.4)

## 2019-09-19 LAB — HIV ANTIBODY (ROUTINE TESTING W REFLEX): HIV Screen 4th Generation wRfx: NONREACTIVE

## 2019-09-19 LAB — HEPATITIS C ANTIBODY: Hep C Virus Ab: <0.1 s/co ratio (ref 0.0–0.9)

## 2019-09-23 ENCOUNTER — Telehealth (INDEPENDENT_AMBULATORY_CARE_PROVIDER_SITE_OTHER): Payer: Self-pay

## 2019-09-23 NOTE — Telephone Encounter (Signed)
Patient returned call. Verified date of birth. He is aware that he does not have gout, HIV or Hepatitis C. Cholesterol normal. He verbalized understanding. Nat Christen, CMA

## 2019-09-23 NOTE — Telephone Encounter (Signed)
-----   Message from Kerin Perna, NP sent at 09/22/2019  4:50 PM EDT ----- Patient does not have gout, HIV or hepatitis C. Cholesterol is unremarkable

## 2020-09-15 IMAGING — CR DG CHEST 2V
2 series · 2 of 2 positions shown · non-contrast
Comparison: 05/29/2017

CLINICAL DATA: Syncopal episode on the bus this morning

EXAM:
CHEST - 2 VIEW

[chest pa]
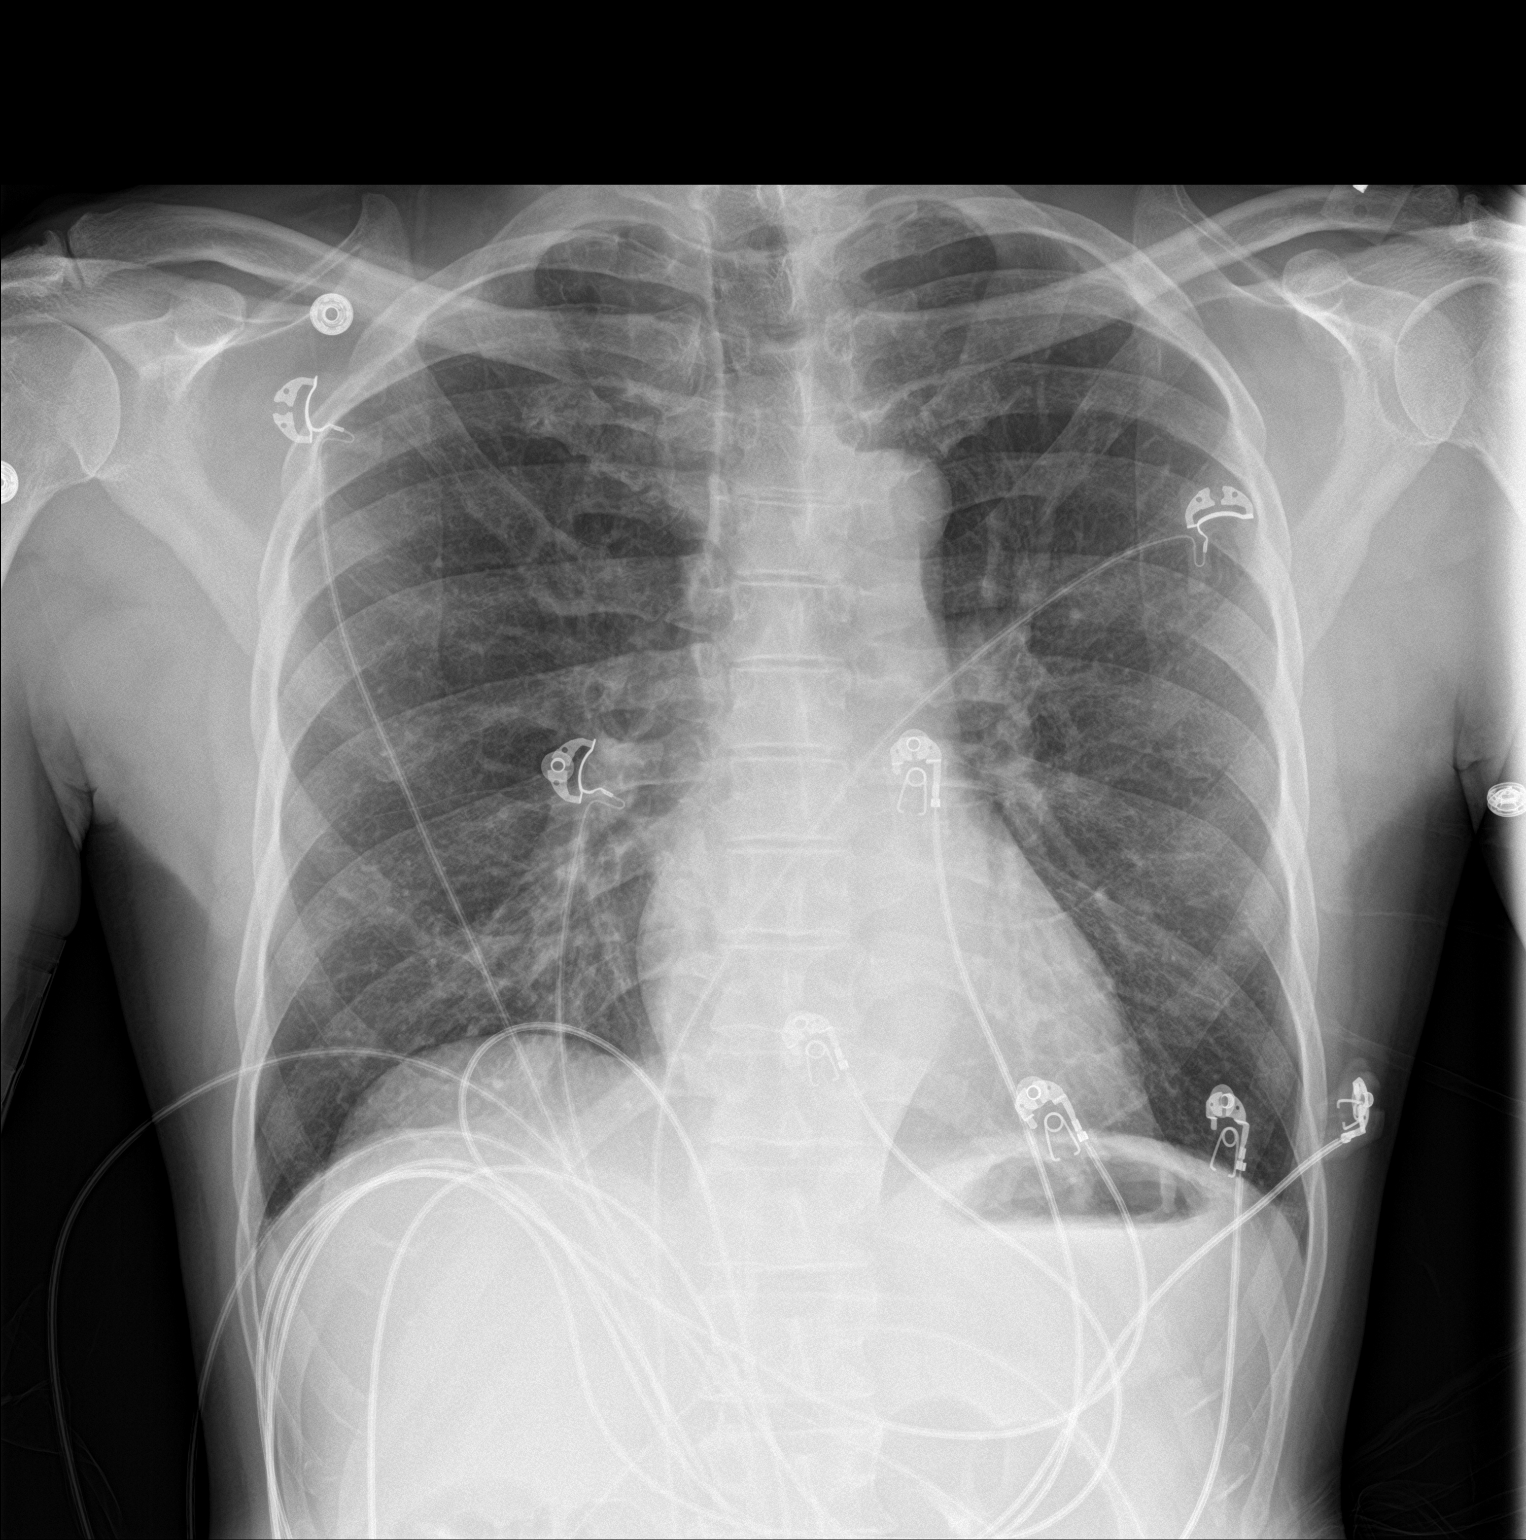

[chest lat]
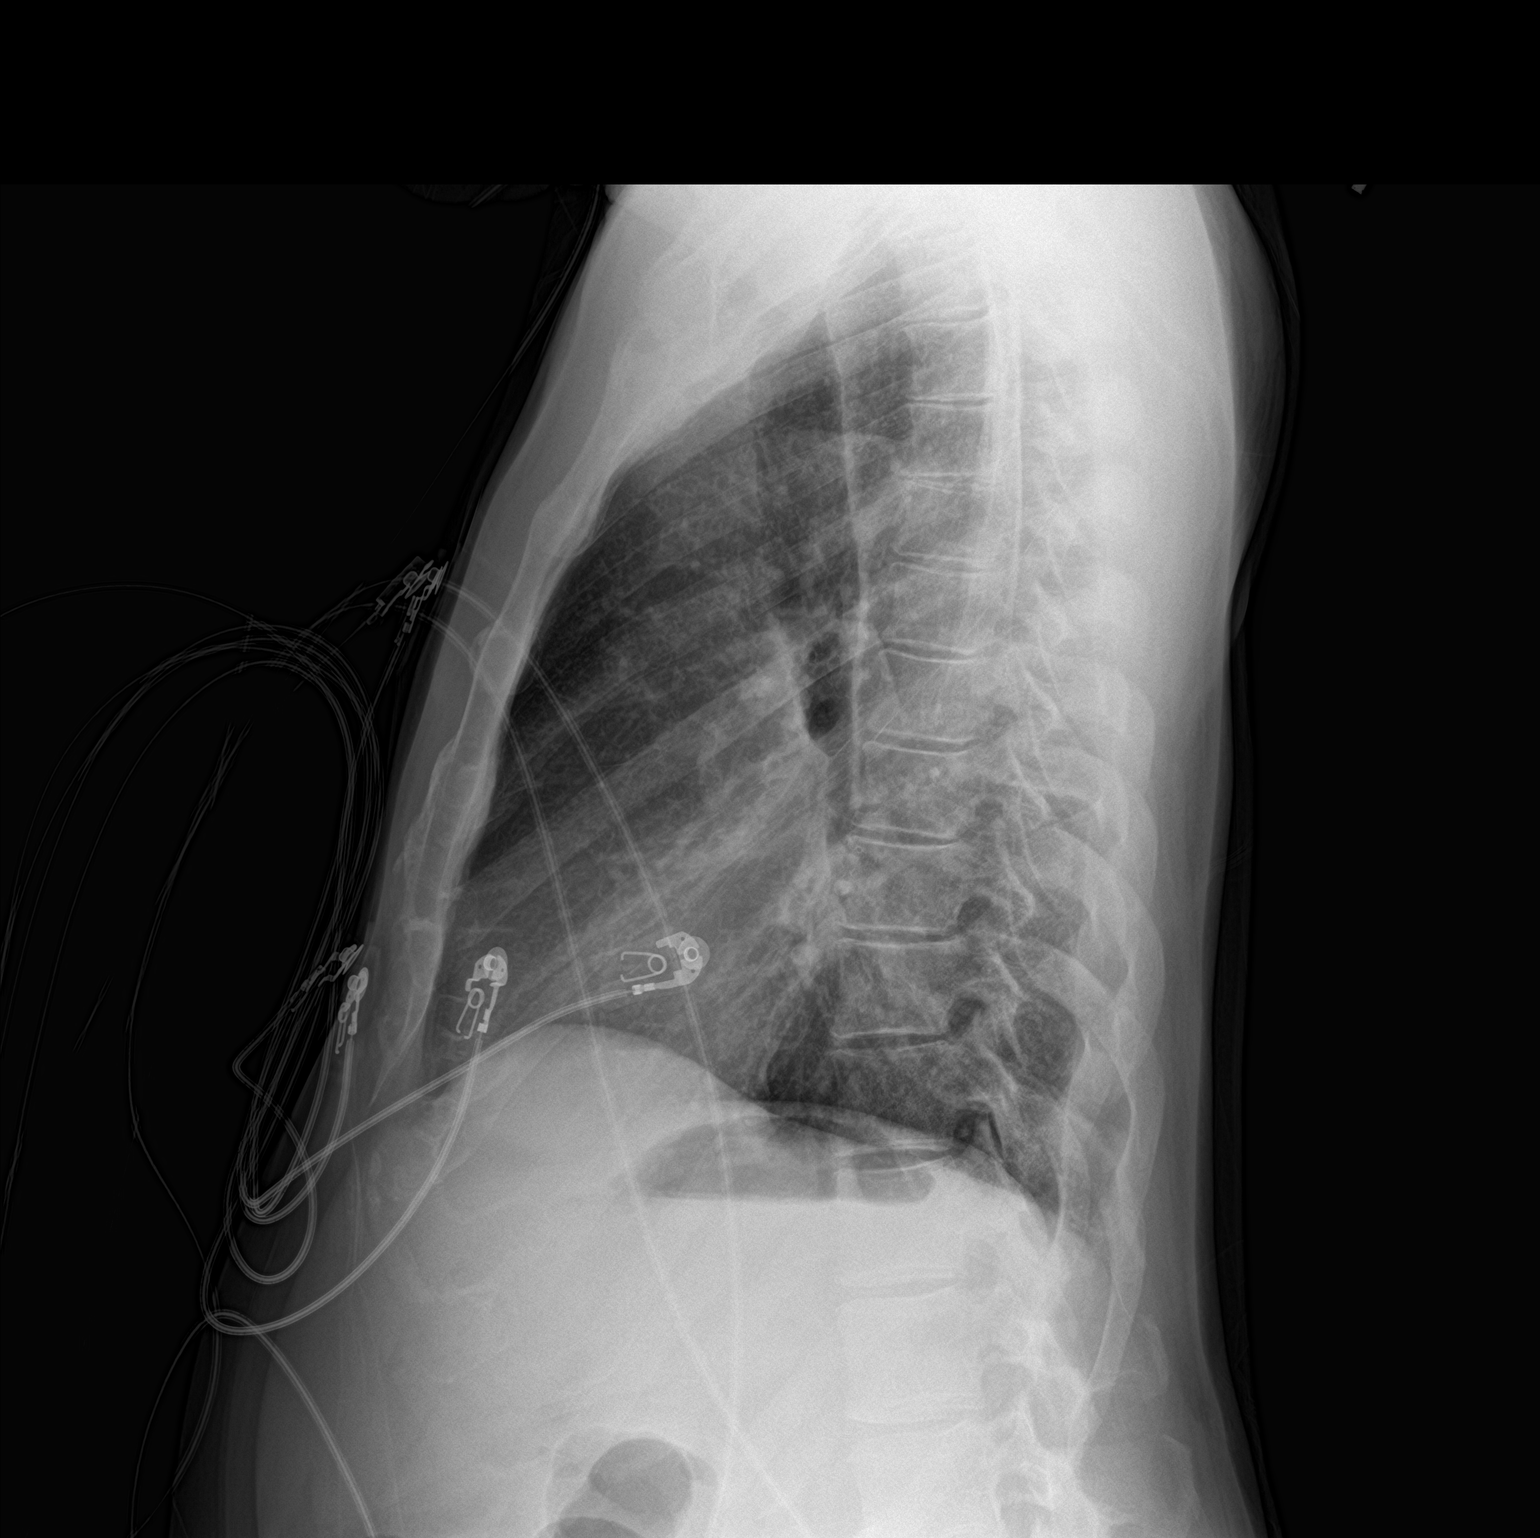

[2 of 2 positions shown; findings below may reference images not displayed]

FINDINGS: Normal heart size, mediastinal contours, and pulmonary vascularity.

Lungs clear.

No pulmonary infiltrate, pleural effusion or pneumothorax.

Osseous structures unremarkable.
IMPRESSION: No acute abnormalities.

## 2020-09-15 IMAGING — CT CT ANGIO CHEST
2 of 6 series · 18 of 46 positions shown · IV contrast (omnipaque)
Comparison: Chest radiograph September 03, 2019

CLINICAL DATA: Syncope

EXAM:
CT ANGIOGRAPHY CHEST WITH CONTRAST
TECHNIQUE: Multidetector CT imaging of the chest was performed using the
standard protocol during bolus administration of intravenous
contrast. Multiplanar CT image reconstructions and MIPs were
obtained to evaluate the vascular anatomy.
CONTRAST:  65 mL OMNIPAQUE IOHEXOL 350 MG/ML SOLN

[Series 6: thins · axial · 0.71mm/px · z∈[+998,+1256]mm · 15 of 284 slices shown]
[im 13/284  lung]
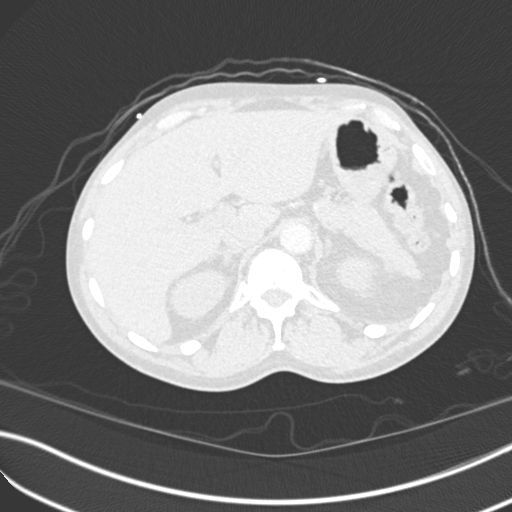
[im 37/284  soft-tissue]
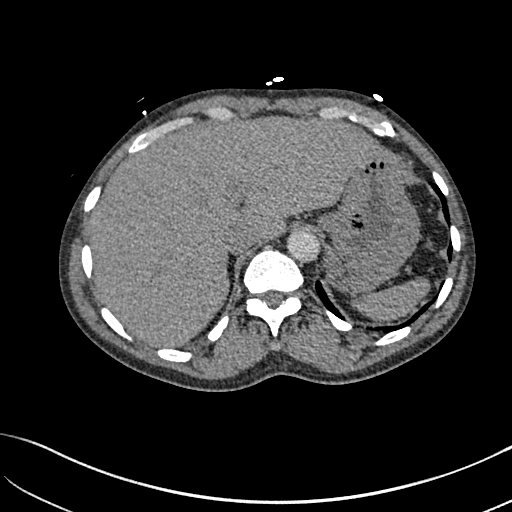
[im 50/284  lung]
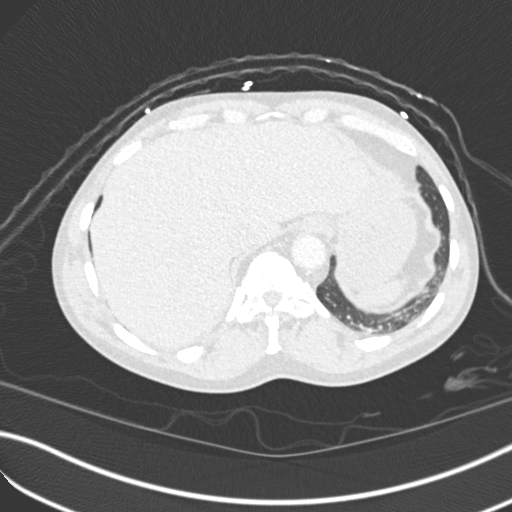
[im 74/284  soft-tissue]
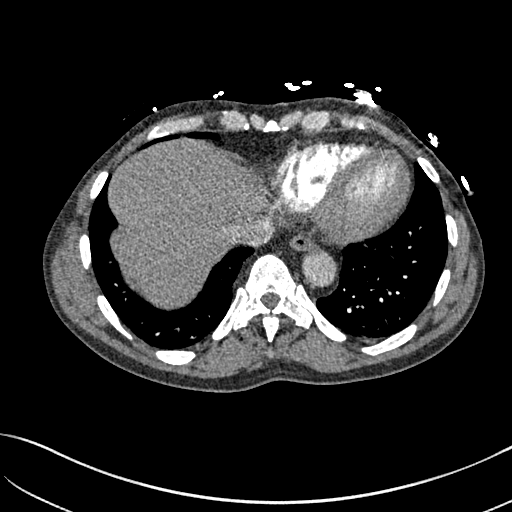
[im 87/284  lung]
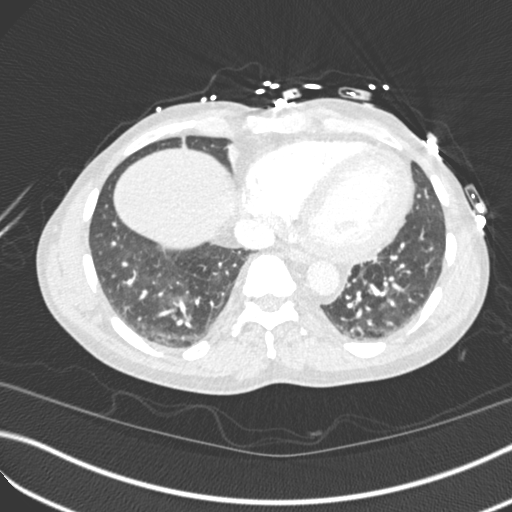
[im 111/284  soft-tissue]
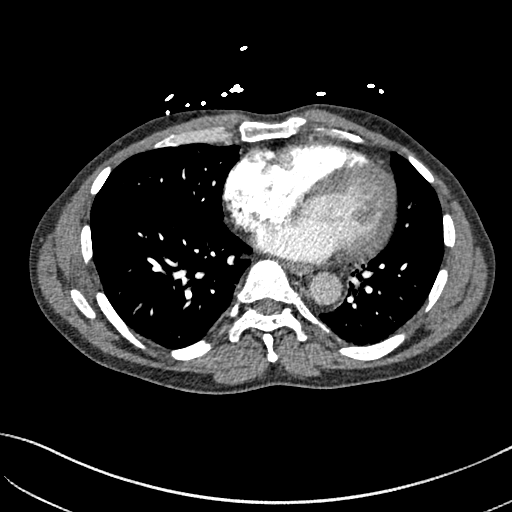
[im 124/284  lung]
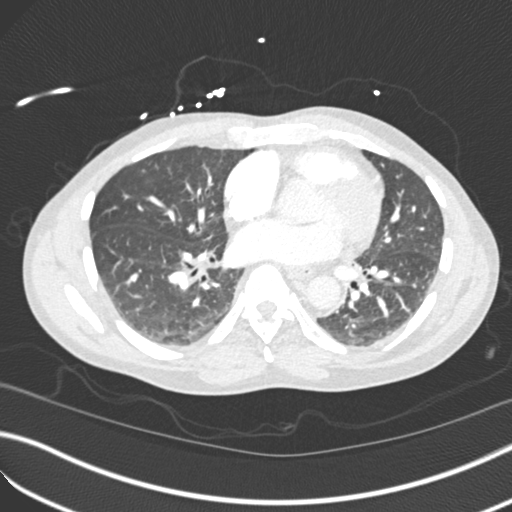
[im 148/284  soft-tissue]
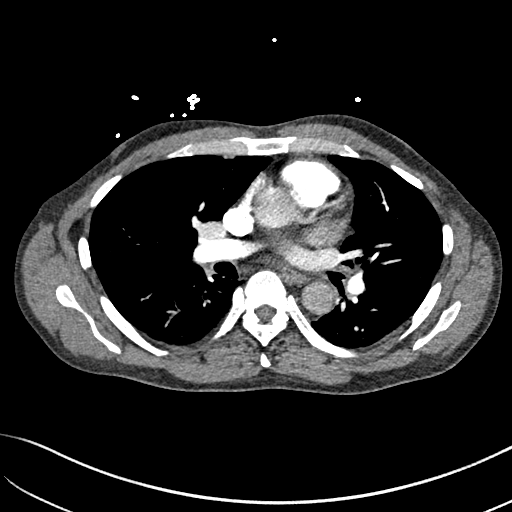
[im 160/284  lung]
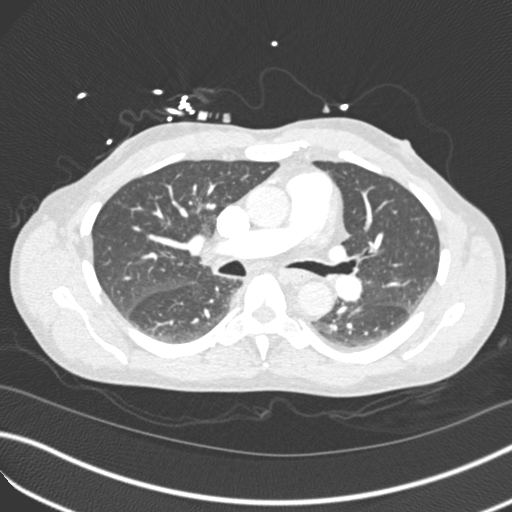
[im 173/284  soft-tissue]
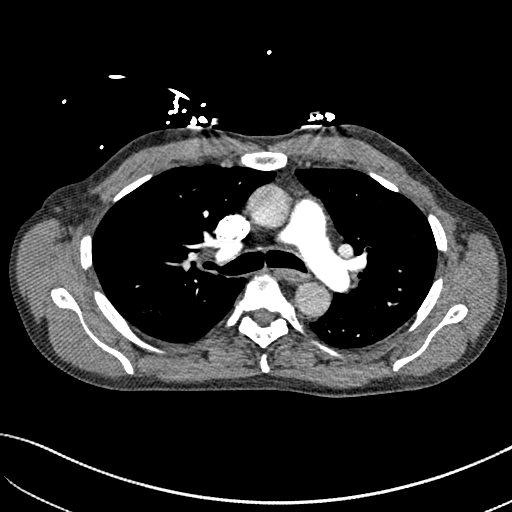
[im 197/284  lung]
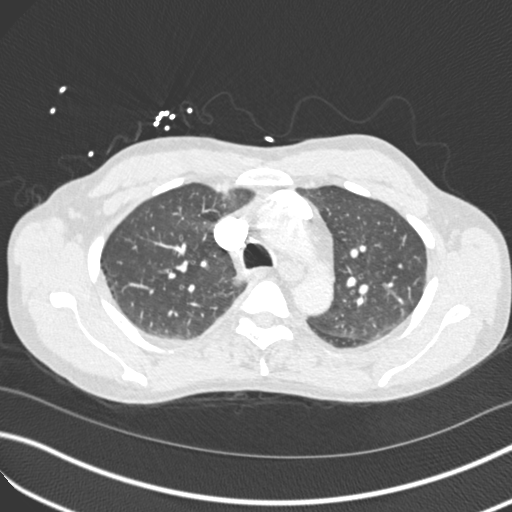
[im 210/284  soft-tissue]
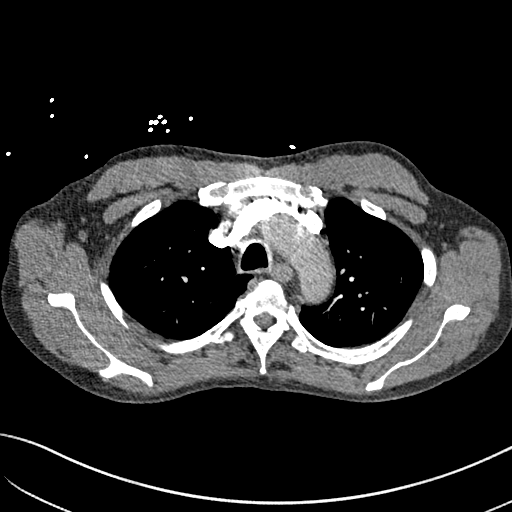
[im 234/284  lung]
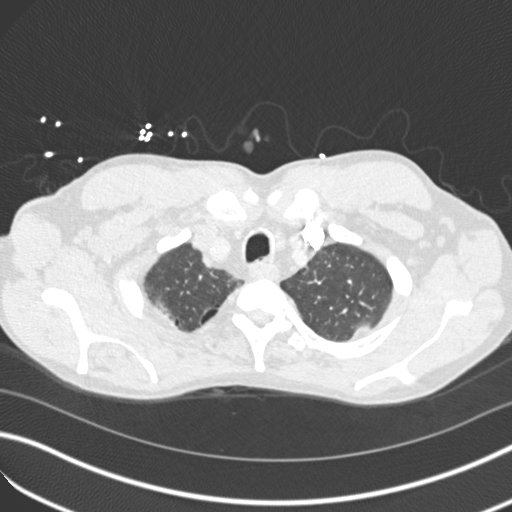
[im 247/284  soft-tissue]
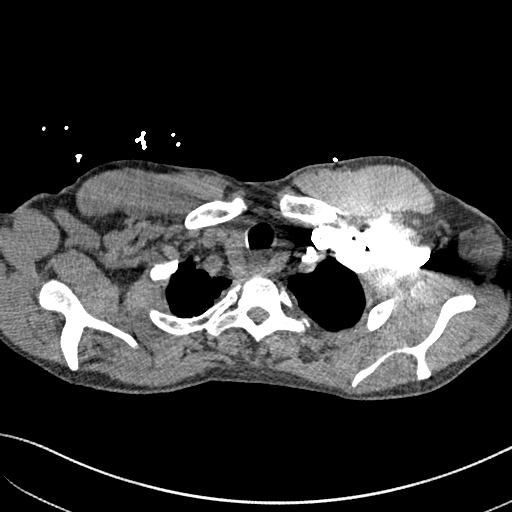
[im 271/284  lung]
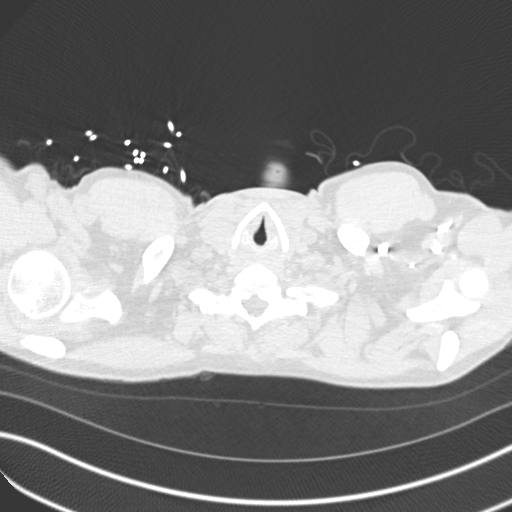

[Series 8: coronal mpr · coronal · 0.59mm/px · 3 of 132 slices shown]
[im 33/132  soft-tissue]
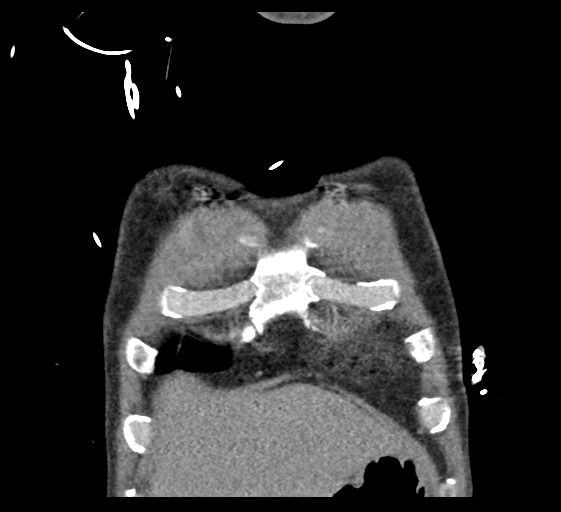
[im 66/132  soft-tissue]
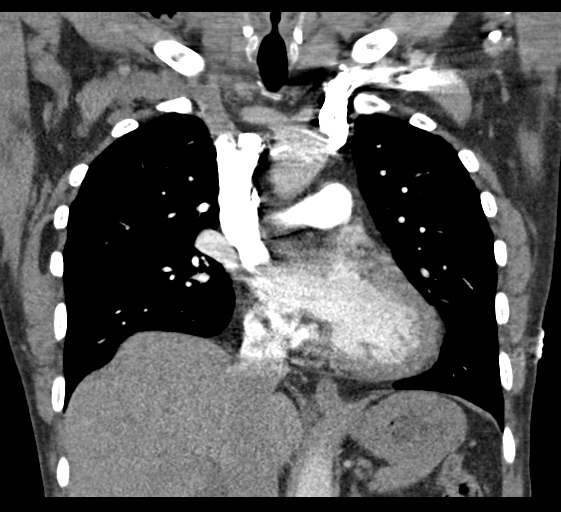
[im 99/132  soft-tissue]
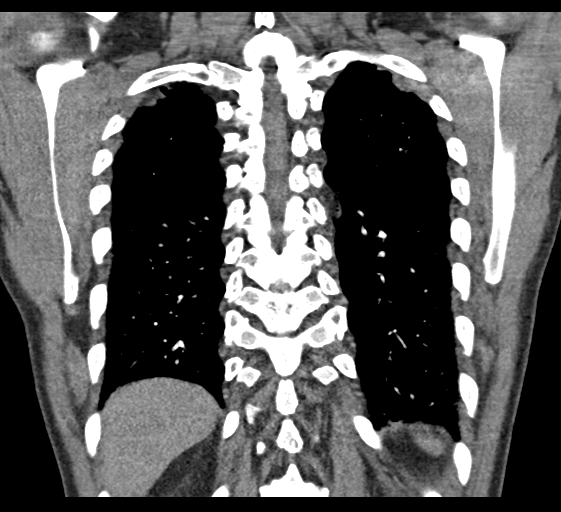

[18 of 46 positions shown; findings below may reference images not displayed]

FINDINGS: Cardiovascular: There is no demonstrable pulmonary embolus. No
thoracic aortic aneurysm. No dissection evident. Note that the
contrast bolus in the aorta is somewhat less than optimal for
dissection assessment. Visualized great vessels appear unremarkable.
Note that the right innominate and left common carotid arteries
arise as a common trunk, an anatomic variant. No pericardial
effusion or pericardial thickening evident.

Mediastinum/Nodes: Thyroid appears unremarkable. There is no evident
thoracic adenopathy. No esophageal lesions are appreciable.

Lungs/Pleura: There are small bullae in the apices. There is
atelectatic change in the posterior lung bases. There is no edema or
airspace opacity. No pleural effusions are evident. On axial slice
53 series 7, there is a 4 mm nodular opacity in the medial segment
of the right middle lobe.

Upper Abdomen: Visualized upper abdominal structures appear
unremarkable.

Musculoskeletal: No blastic or lytic bone lesions. No evident chest
wall lesions.

Review of the MIP images confirms the above findings.
IMPRESSION: 1. No demonstrable pulmonary embolus. No thoracic aortic aneurysm.
No dissection evident. Contrast bolus in the aorta is less than
optimal for dissection assessment.

2. Areas of mild atelectatic change. No edema or airspace opacity. 4
mm nodular opacity noted in right middle lobe. No follow-up needed
if patient is low-risk. Non-contrast chest CT can be considered in
12 months if patient is high-risk. This recommendation follows the
consensus statement: Guidelines for Management of Incidental
Pulmonary Nodules Detected on CT Images: From the [HOSPITAL]

3.  No evident adenopathy.

## 2021-12-18 ENCOUNTER — Encounter (HOSPITAL_COMMUNITY): Payer: Self-pay

## 2021-12-18 ENCOUNTER — Other Ambulatory Visit: Payer: Self-pay

## 2021-12-18 ENCOUNTER — Emergency Department (HOSPITAL_COMMUNITY)
Admission: EM | Admit: 2021-12-18 | Discharge: 2021-12-18 | Disposition: A | Discharge status: Home / Self Care | Payer: Commercial Managed Care - HMO | Attending: Emergency Medicine | Admitting: Emergency Medicine

## 2021-12-18 DIAGNOSIS — W540XXA Bitten by dog, initial encounter: Secondary | ICD-10-CM | POA: Insufficient documentation

## 2021-12-18 DIAGNOSIS — S71152A Open bite, left thigh, initial encounter: Secondary | ICD-10-CM | POA: Insufficient documentation

## 2021-12-18 DIAGNOSIS — S79922A Unspecified injury of left thigh, initial encounter: Secondary | ICD-10-CM | POA: Diagnosis present

## 2021-12-18 MED ORDER — DOXYCYCLINE HYCLATE 100 MG PO CAPS: 100.0000 mg | ORAL_CAPSULE | Freq: Two times a day (BID) | ORAL | 0 refills | Status: DC

## 2021-12-18 MED ORDER — CLINDAMYCIN HCL 300 MG PO CAPS: 300.0000 mg | ORAL_CAPSULE | Freq: Three times a day (TID) | ORAL | 0 refills | Status: AC

## 2021-12-18 NOTE — Discharge Instructions (Addendum)
Please follow-up with your PCP for a wound recheck. We will place you on antibiotics for infection prevention. The wounds will heal by secondary intention, meaning they will scab over and heal from the inside out. Change your dressings daily with a gauze dressing.

## 2021-12-18 NOTE — ED Triage Notes (Signed)
Pt arrived to ED via [facility] w/ c/o dog bite to L thigh. 3 puncture marks noted. Pt VSS and A&Ox4.

## 2021-12-18 NOTE — ED Notes (Signed)
Wound irrigated and dressed per RN order.

## 2021-12-18 NOTE — ED Notes (Signed)
Pt w/ steady ambulatory gait. Crutches provided for pt comfort.

## 2021-12-18 NOTE — ED Notes (Signed)
Reviewed discharge instructions with patient. Follow-up care and medications reviewed. Patient  verbalized understanding. Patient A&Ox4, VSS, and ambulatory with steady gait upon discharge.  °

## 2021-12-18 NOTE — ED Provider Notes (Signed)
[facility] EMERGENCY DEPARTMENT Provider Note   CSN: [id number] Arrival date & time: 12/18/21  1337     History  Chief Complaint  Patient presents with   Animal Bite    Bryan Curtis is a 61 y.o. male.   Animal Bite Contact animal:  Dog Location:  Leg Leg injury location:  L upper leg Notifications:  Law enforcement Animal's rabies vaccination status:  Up to date Animal in possession: yes   Tetanus status:  Up to date  61 year old male presenting to the emergency department with a chief complaint of dog bite.  The patient states that shortly prior to arrival he was bit by a neighbor's pit bull in his left thigh.  He sustained 3 puncture marks, 2 to the lateral thigh with skin avulsion and 1 to the medial thigh with an additional avulsion of skin.  He denies any other injuries or complaints.  He states that he is up-to-date on his tetanus.  He states the dog is in custody and is reportedly up-to-date on vaccinations.    Home Medications Prior to Admission medications   Medication Sig Start Date End Date Taking? Authorizing Provider  clindamycin (CLEOCIN) 300 MG capsule Take 1 capsule (300 mg total) by mouth 3 (three) times daily for 7 days. 12/18/21 12/25/21 Yes Regan Lemming, MD  doxycycline (VIBRAMYCIN) 100 MG capsule Take 1 capsule (100 mg total) by mouth 2 (two) times daily. 12/18/21  Yes Regan Lemming, MD      Allergies    Penicillins    Review of Systems   Review of Systems  All other systems reviewed and are negative.   Physical Exam Updated Vital Signs BP 114/87   Pulse 91   Temp 98.5 F (36.9 C) (Temporal)   Resp 20   Ht 5' 11.5" (1.816 m)   Wt 71.7 kg   SpO2 98%   BMI 21.73 kg/m  Physical Exam Vitals and nursing note reviewed.  Constitutional:      General: He is not in acute distress. HENT:     Head: Normocephalic and atraumatic.  Eyes:     Conjunctiva/sclera: Conjunctivae normal.     Pupils: Pupils are equal, round, and  reactive to light.  Cardiovascular:     Rate and Rhythm: Normal rate and regular rhythm.  Pulmonary:     Effort: Pulmonary effort is normal. No respiratory distress.  Abdominal:     General: There is no distension.     Tenderness: There is no guarding.  Musculoskeletal:        General: No deformity or signs of injury.     Cervical back: Neck supple.     Comments: Three puncture wounds, two to the lateral left thigh and one to the medial left thigh, hemostatic.  Skin:    Findings: No lesion or rash.  Neurological:     General: No focal deficit present.     Mental Status: He is alert. Mental status is at baseline.        ED Results / Procedures / Treatments   Labs (all labs ordered are listed, but only abnormal results are displayed) Labs Reviewed - No data to display  EKG None  Radiology No results found.  Procedures Procedures    Medications Ordered in ED Medications - No data to display  ED Course/ Medical Decision Making/ A&P  Medical Decision Making Risk Prescription drug management.    61 year old male presenting to the emergency department with a chief complaint of dog bite.  The patient states that shortly prior to arrival he was bit by a neighbor's pit bull in his left thigh.  He sustained 3 puncture marks, 2 to the lateral thigh with skin avulsion and 1 to the medial thigh with an additional avulsion of skin.  He denies any other injuries or complaints.  He states that he is up-to-date on his tetanus.  He states the dog is in custody and is reportedly up-to-date on vaccinations.   On arrival, the patient was vitally stable.  Presenting with 3 small puncture wounds to the left thigh after being bit by dog.  The wounds were copiously irrigated by nursing.  Discussed primary closure versus healing by secondary intention with the patient which is the option we will proceed with today.  The wounds were cleaned and then dressed by  nursing staff.  We will discharge her on a course of antibiotics have the patient follow-up with his PCP for wound check.  Stable for discharge.  Final Clinical Impression(s) / ED Diagnoses Final diagnoses:  Dog bite, initial encounter    Rx / DC Orders ED Discharge Orders          Ordered    clindamycin (CLEOCIN) 300 MG capsule  3 times daily        12/18/21 1448    doxycycline (VIBRAMYCIN) 100 MG capsule  2 times daily        12/18/21 1448              Regan Lemming, MD 12/18/21 1512

## 2021-12-21 ENCOUNTER — Encounter (INDEPENDENT_AMBULATORY_CARE_PROVIDER_SITE_OTHER): Payer: Self-pay | Admitting: Primary Care

## 2021-12-21 ENCOUNTER — Ambulatory Visit (INDEPENDENT_AMBULATORY_CARE_PROVIDER_SITE_OTHER): Payer: Commercial Managed Care - HMO | Admitting: Primary Care

## 2021-12-21 VITALS — BP 131/87 | HR 97 | Temp 98.2°F | Ht 71.5 in | Wt 146.2 lb

## 2021-12-21 DIAGNOSIS — Z09 Encounter for follow-up examination after completed treatment for conditions other than malignant neoplasm: Secondary | ICD-10-CM

## 2021-12-21 DIAGNOSIS — S71152D Open bite, left thigh, subsequent encounter: Secondary | ICD-10-CM

## 2021-12-21 NOTE — Progress Notes (Signed)
[place], is a 61 y.o. male  RCV:[id number]  ZWC:[id number]  DOB - [birth year]  Chief Complaint  Patient presents with   Hospitalization Follow-up    Dog bite        Subjective:   Bryan Curtis is a 61 y.o. male here today for a follow up visit from ED dog bite. Patient bit by a neighbor's pit bull in his left thigh.  He sustained 3 puncture marks, 2 to the lateral thigh with skin avulsion and 1 to the medial thigh with an additional avulsion of skin. Currently, on 2 abt's clindamycin (CLEOCIN) 300 MG TID for 7 days  and doxycycline (VIBRAMYCIN) 100 MG capsule BID still on medication will take until completed.  Patient has No headache, No chest pain, No abdominal pain - No Nausea, No new weakness tingling or numbness, No Cough - shortness of breath  No problems updated.  Allergies  Allergen Reactions   Penicillins Other (See Comments)    Childhood reaction  Did it involve swelling of the face/tongue/throat, SOB, or low BP? Unknown Did it involve sudden or severe rash/hives, skin peeling, or any reaction on the inside of your mouth or nose? Unknown Did you need to seek medical attention at a hospital or doctor's office? Unknown When did it last happen?   pt was young child    If all above answers are "NO", may proceed with cephalosporin use.     Past Medical History:  Diagnosis Date   MI, old     Current Outpatient Medications on File Prior to Visit  Medication Sig Dispense Refill   clindamycin (CLEOCIN) 300 MG capsule Take 1 capsule (300 mg total) by mouth 3 (three) times daily for 7 days. 21 capsule 0   doxycycline (VIBRAMYCIN) 100 MG capsule Take 1 capsule (100 mg total) by mouth 2 (two) times daily. 20 capsule 0   No current facility-administered medications on file prior to visit.    Objective:   Vitals:   12/21/21 1614  BP: 131/87  Pulse: 97  Temp: 98.2 F (36.8 C)  TempSrc: Oral  SpO2: 96%  Weight: 146 lb 3.2 oz (66.3  kg)  Height: 5' 11.5" (1.816 m)    Exam General appearance : Awake, alert, not in any distress. Speech Clear. Not toxic looking HEENT: Atraumatic and Normocephalic, pupils equally reactive to light and accomodation Neck: Supple, no JVD. No cervical lymphadenopathy.  Chest: Good air entry bilaterally, no added sounds  CVS: S1 S2 regular, no murmurs.  Abdomen: Bowel sounds present, Non tender and not distended with no gaurding, rigidity or rebound. Extremities: B/L Lower Ext shows no edema, both legs are warm to touch Neurology: Awake alert, and oriented X 3, CN II-XII intact, Non focal Skin: No Rash  Data Review No results found for: "HGBA1C"  Assessment & Plan   Macai was seen today for hospitalization follow-up.  Diagnoses and all orders for this visit:  Hospital discharge follow-up   Complete abt's any s/s return to UC/PCP Follow-Ups: Schedule an appointment with Kerin Perna, NP (Internal Medicine); For wound re-check    Patient have been counseled extensively about nutrition and exercise. Other issues discussed during this visit include: low cholesterol diet, weight control and daily exercise, foot care, annual eye examinations at Ophthalmology, importance of adherence with medications and regular follow-up. We also discussed long term complications of uncontrolled diabetes and hypertension.   No follow-ups on file.  The patient was given clear instructions to go  to ER or return to medical center if symptoms don't improve, worsen or new problems develop. The patient verbalized understanding. The patient was told to call to get lab results if they haven't heard anything in the next week.   This note has been created with Surveyor, quantity. Any transcriptional errors are unintentional.   Kerin Perna, NP 12/21/2021, 4:26 PM

## 2021-12-22 ENCOUNTER — Telehealth (INDEPENDENT_AMBULATORY_CARE_PROVIDER_SITE_OTHER): Payer: Self-pay | Admitting: Primary Care

## 2021-12-22 NOTE — Telephone Encounter (Signed)
Request routed to PCP ?

## 2021-12-22 NOTE — Telephone Encounter (Signed)
Pt called back to report that his letter needs to be updated, it needs to say June 19th instead of June 17th, pt wants to be called when this is ready for pickup he says he needs it today or tomorrow.  [phone]

## 2021-12-22 NOTE — Telephone Encounter (Signed)
Copied from [place] [id number]. Topic: General - Inquiry >> Dec 22, 2021  8:36 AM Marcellus Scott wrote: Reason for CRM: Pt stated PCP wrote a return-to-work note for him; however, she accidentally put the wrong date. Pt stated she put the date of 6/17 instead of 6/19. Pt is asking, if possible, to have PCP write a new note for his employer with the correct date of 6/19.  Pt would like to pick up the note.

## 2022-02-02 ENCOUNTER — Ambulatory Visit (INDEPENDENT_AMBULATORY_CARE_PROVIDER_SITE_OTHER): Payer: Commercial Managed Care - HMO | Admitting: Primary Care

## 2022-02-02 ENCOUNTER — Encounter (INDEPENDENT_AMBULATORY_CARE_PROVIDER_SITE_OTHER): Payer: Self-pay | Admitting: Primary Care

## 2022-02-02 VITALS — BP 156/93 | HR 86 | Temp 98.2°F | Ht 71.5 in | Wt 144.2 lb

## 2022-02-02 DIAGNOSIS — D1723 Benign lipomatous neoplasm of skin and subcutaneous tissue of right leg: Secondary | ICD-10-CM | POA: Diagnosis not present

## 2022-02-02 DIAGNOSIS — I10 Essential (primary) hypertension: Secondary | ICD-10-CM

## 2022-02-02 NOTE — Patient Instructions (Addendum)
Friday 8:30 Hypertension, Adult High blood pressure (hypertension) is when the force of blood pumping through the arteries is too strong. The arteries are the blood vessels that carry blood from the heart throughout the body. Hypertension forces the heart to work harder to pump blood and may cause arteries to become narrow or stiff. Untreated or uncontrolled hypertension can lead to a heart attack, heart failure, a stroke, kidney disease, and other problems. A blood pressure reading consists of a higher number over a lower number. Ideally, your blood pressure should be below 120/80. The first ("top") number is called the systolic pressure. It is a measure of the pressure in your arteries as your heart beats. The second ("bottom") number is called the diastolic pressure. It is a measure of the pressure in your arteries as the heart relaxes. What are the causes? The exact cause of this condition is not known. There are some conditions that result in high blood pressure. What increases the risk? Certain factors may make you more likely to develop high blood pressure. Some of these risk factors are under your control, including: Smoking. Not getting enough exercise or physical activity. Being overweight. Having too much fat, sugar, calories, or salt (sodium) in your diet. Drinking too much alcohol. Other risk factors include: Having a personal history of heart disease, diabetes, high cholesterol, or kidney disease. Stress. Having a family history of high blood pressure and high cholesterol. Having obstructive sleep apnea. Age. The risk increases with age. What are the signs or symptoms? High blood pressure may not cause symptoms. Very high blood pressure (hypertensive crisis) may cause: Headache. Fast or irregular heartbeats (palpitations). Shortness of breath. Nosebleed. Nausea and vomiting. Vision changes. Severe chest pain, dizziness, and seizures. How is this diagnosed? This condition is  diagnosed by measuring your blood pressure while you are seated, with your arm resting on a flat surface, your legs uncrossed, and your feet flat on the floor. The cuff of the blood pressure monitor will be placed directly against the skin of your upper arm at the level of your heart. Blood pressure should be measured at least twice using the same arm. Certain conditions can cause a difference in blood pressure between your right and left arms. If you have a high blood pressure reading during one visit or you have normal blood pressure with other risk factors, you may be asked to: Return on a different day to have your blood pressure checked again. Monitor your blood pressure at home for 1 week or longer. If you are diagnosed with hypertension, you may have other blood or imaging tests to help your health care provider understand your overall risk for other conditions. How is this treated? This condition is treated by making healthy lifestyle changes, such as eating healthy foods, exercising more, and reducing your alcohol intake. You may be referred for counseling on a healthy diet and physical activity. Your health care provider may prescribe medicine if lifestyle changes are not enough to get your blood pressure under control and if: Your systolic blood pressure is above 130. Your diastolic blood pressure is above 80. Your personal target blood pressure may vary depending on your medical conditions, your age, and other factors. Follow these instructions at home: Eating and drinking  Eat a diet that is high in fiber and potassium, and low in sodium, added sugar, and fat. An example of this eating plan is called the DASH diet. DASH stands for Dietary Approaches to Stop Hypertension. To eat this  way: Eat plenty of fresh fruits and vegetables. Try to fill one half of your plate at each meal with fruits and vegetables. Eat whole grains, such as whole-wheat pasta, brown rice, or whole-grain bread. Fill  about one fourth of your plate with whole grains. Eat or drink low-fat dairy products, such as skim milk or low-fat yogurt. Avoid fatty cuts of meat, processed or cured meats, and poultry with skin. Fill about one fourth of your plate with lean proteins, such as fish, chicken without skin, beans, eggs, or tofu. Avoid pre-made and processed foods. These tend to be higher in sodium, added sugar, and fat. Reduce your daily sodium intake. Many people with hypertension should eat less than 1,500 mg of sodium a day. Do not drink alcohol if: Your health care provider tells you not to drink. You are pregnant, may be pregnant, or are planning to become pregnant. If you drink alcohol: Limit how much you have to: 0-1 drink a day for women. 0-2 drinks a day for men. Know how much alcohol is in your drink. In the [place] one drink equals one 12 oz bottle of beer (355 mL), one 5 oz glass of wine (148 mL), or one 1 oz glass of hard liquor (44 mL). Lifestyle  Work with your health care provider to maintain a healthy body weight or to lose weight. Ask what an ideal weight is for you. Get at least 30 minutes of exercise that causes your heart to beat faster (aerobic exercise) most days of the week. Activities may include walking, swimming, or biking. Include exercise to strengthen your muscles (resistance exercise), such as Pilates or lifting weights, as part of your weekly exercise routine. Try to do these types of exercises for 30 minutes at least 3 days a week. Do not use any products that contain nicotine or tobacco. These products include cigarettes, chewing tobacco, and vaping devices, such as e-cigarettes. If you need help quitting, ask your health care provider. Monitor your blood pressure at home as told by your health care provider. Keep all follow-up visits. This is important. Medicines Take over-the-counter and prescription medicines only as told by your health care provider. Follow directions  carefully. Blood pressure medicines must be taken as prescribed. Do not skip doses of blood pressure medicine. Doing this puts you at risk for problems and can make the medicine less effective. Ask your health care provider about side effects or reactions to medicines that you should watch for. Contact a health care provider if you: Think you are having a reaction to a medicine you are taking. Have headaches that keep coming back (recurring). Feel dizzy. Have swelling in your ankles. Have trouble with your vision. Get help right away if you: Develop a severe headache or confusion. Have unusual weakness or numbness. Feel faint. Have severe pain in your chest or abdomen. Vomit repeatedly. Have trouble breathing. These symptoms may be an emergency. Get help right away. Call 911. Do not wait to see if the symptoms will go away. Do not drive yourself to the hospital. Summary Hypertension is when the force of blood pumping through your arteries is too strong. If this condition is not controlled, it may put you at risk for serious complications. Your personal target blood pressure may vary depending on your medical conditions, your age, and other factors. For most people, a normal blood pressure is less than 120/80. Hypertension is treated with lifestyle changes, medicines, or a combination of both. Lifestyle changes include losing weight, eating  a healthy, low-sodium diet, exercising more, and limiting alcohol. This information is not intended to replace advice given to you by your health care provider. Make sure you discuss any questions you have with your health care provider. Document Revised: 05/03/2021 Document Reviewed: 05/03/2021 Elsevier Patient Education  [place], Maria Stein, [place] surgeon in [place], [place]

## 2022-02-02 NOTE — Progress Notes (Signed)
Bryan Curtis, is a 61 y.o. male  GYJ:[id number]  [id number]  DOB - 03-Oct-1960  Chief Complaint  Patient presents with   Cyst    Front of right knee since 2015 has grown recently painful and warm to touch        Subjective:   Bryan Curtis is a 61 y.o. male here today for a acute visit. He has lipoma (?) on his right knee painful, warm to touch. Asked patient may I Face Time a orthopedic physician he said Yes. Dr. from Orthopedic surgeon in [place], [place] looked at the patient knee and said have him seen in the office tomorrow referral placed . Patient states a smaller presented in 2015 until now which he refers to his second knee. Patient has No headache, No chest pain, No abdominal pain - No Nausea, No new weakness tingling or numbness, No Cough - shortness of breath  No problems updated.  Allergies  Allergen Reactions   Penicillins Other (See Comments)    Childhood reaction  Did it involve swelling of the face/tongue/throat, SOB, or low BP? Unknown Did it involve sudden or severe rash/hives, skin peeling, or any reaction on the inside of your mouth or nose? Unknown Did you need to seek medical attention at a hospital or doctor's office? Unknown When did it last happen?   pt was young child    If all above answers are "NO", may proceed with cephalosporin use.     Past Medical History:  Diagnosis Date   MI, old     Current Outpatient Medications on File Prior to Visit  Medication Sig Dispense Refill   doxycycline (VIBRAMYCIN) 100 MG capsule Take 1 capsule (100 mg total) by mouth 2 (two) times daily. 20 capsule 0   No current facility-administered medications on file prior to visit.    Objective:   Vitals:   02/02/22 1520  BP: (!) 156/93  Pulse: 86  Temp: 98.2 F (36.8 C)  TempSrc: Oral  SpO2: 97%  Weight: 144 lb 3.2 oz (65.4 kg)  Height: 5' 11.5" (1.816 m)    Exam General appearance : Awake, alert, not in any  distress. Speech Clear. Not toxic looking HEENT: Atraumatic and Normocephalic, pupils equally reactive to light and accomodation Neck: Supple, no JVD. No cervical lymphadenopathy.  Chest: Good air entry bilaterally, no added sounds  CVS: S1 S2 regular, no murmurs.  Abdomen: Bowel sounds present, Non tender and not distended with no gaurding, rigidity or rebound. Extremities: B/L Lower Ext shows no edema, both legs are warm to touch (right knee lipoma ?) Neurology: Awake alert, and oriented X 3, CN II-XII intact, Non focal Skin: No Rash  Data Review No results found for: "HGBA1C"  Assessment & Plan   1. Lipoma of right lower extremity   Elevated blood pressure reading in office with diagnosis of hypertension Counseled on blood pressure goal of less than 130/80, low-sodium, DASH diet, medication compliance, 150 minutes of moderate intensity exercise per week.  Kerin Perna.   Patient have been counseled extensively about nutrition and exercise. Other issues discussed during this visit include: low cholesterol diet, weight control and daily exercise, foot care, annual eye examinations at Ophthalmology, importance of adherence with medications and regular follow-up. We also discussed long term complications of uncontrolled diabetes and hypertension.     The patient was given clear instructions to go to ER or return to medical center if symptoms don't improve, worsen or new problems develop. The  patient verbalized understanding. The patient was told to call to get lab results if they haven't heard anything in the next week.   This note has been created with Surveyor, quantity. Any transcriptional errors are unintentional.   Kerin Perna, NP 02/02/2022, 3:50 PM;

## 2022-02-03 ENCOUNTER — Ambulatory Visit (INDEPENDENT_AMBULATORY_CARE_PROVIDER_SITE_OTHER): Payer: Commercial Managed Care - HMO | Admitting: Orthopaedic Surgery

## 2022-02-03 ENCOUNTER — Encounter: Payer: Self-pay | Admitting: Orthopaedic Surgery

## 2022-02-03 ENCOUNTER — Ambulatory Visit (INDEPENDENT_AMBULATORY_CARE_PROVIDER_SITE_OTHER): Payer: Commercial Managed Care - HMO

## 2022-02-03 DIAGNOSIS — G8929 Other chronic pain: Secondary | ICD-10-CM

## 2022-02-03 DIAGNOSIS — M25561 Pain in right knee: Secondary | ICD-10-CM

## 2022-02-03 MED ORDER — BUPIVACAINE HCL 0.25 % IJ SOLN
2.0000 mL | INTRAMUSCULAR | Status: AC | PRN
  Administered 2022-02-03: 2 mL via INTRA_ARTICULAR

## 2022-02-03 MED ORDER — SULFAMETHOXAZOLE-TRIMETHOPRIM 800-160 MG PO TABS: 1.0000 | ORAL_TABLET | Freq: Two times a day (BID) | ORAL | 0 refills | Status: DC

## 2022-02-03 MED ORDER — LIDOCAINE HCL 1 % IJ SOLN
2.0000 mL | INTRAMUSCULAR | Status: AC | PRN
  Administered 2022-02-03: 2 mL

## 2022-02-03 MED ORDER — TRAMADOL HCL 50 MG PO TABS: 50.0000 mg | ORAL_TABLET | Freq: Two times a day (BID) | ORAL | 2 refills | Status: DC | PRN

## 2022-02-03 NOTE — Progress Notes (Signed)
Office Visit Note   Patient: Bryan Curtis           Date of Birth: 26-Jul-1960           MRN: [id number] Visit Date: 02/03/2022              Requested by: Kerin Perna, NP [address] [place],  [place] [address] PCP: Kerin Perna, NP   Assessment & Plan: Visit Diagnoses:  1. Chronic pain of right knee     Plan: Impression is right knee questionable soft tissue infection possibly from the adjacent wound.  Today, I attempted to aspirate this but was unsuccessful.  I have sent in Bactrim to take for 10 days.  He will follow-up with [place] in 2 weeks for recheck.  If his symptoms worsen in the meantime or if he develops fevers and chills he will let us know sooner.  Call with concerns or questions.  Follow-Up Instructions: Return in about 2 weeks (around 02/17/2022).   Orders:  Orders Placed This Encounter  Procedures   Large Joint Inj: R knee   XR KNEE 3 VIEW RIGHT   Meds ordered this encounter  Medications   sulfamethoxazole-trimethoprim (BACTRIM DS) 800-160 MG tablet    Sig: Take 1 tablet by mouth 2 (two) times daily.    Dispense:  20 tablet    Refill:  0   traMADol (ULTRAM) 50 MG tablet    Sig: Take 1 tablet (50 mg total) by mouth every 12 (twelve) hours as needed.    Dispense:  30 tablet    Refill:  2      Procedures: Large Joint Inj: R knee on 02/03/2022 9:11 AM Indications: pain Details: 22 G needle, anterolateral approach Medications: 2 mL lidocaine 1 %; 2 mL bupivacaine 0.25 %      Clinical Data: No additional findings.   Subjective: Chief Complaint  Patient presents with   Right Knee - Pain    HPI patient is a pleasant 61 year old gentleman who comes in today with pain and swelling to his right proximal tibia.  He noticed this swelling several years ago which did not become painful until this past Wednesday.  He denies ever having any injury to the area.  No recent bug bite or scratch.  He denies any fevers or chills or any other  systemic symptoms.  He does note that he had a scab just lateral to this area about a month ago which started draining after he picked this.  It is since scabbed over.  This area is not tender.  He notes the area of concern is actually decreased in size since 1 today but still notes pain, warmth and slight erythema.  Symptoms are worse with walking.  He saw his primary care provider yesterday who referred him to [place].  Currently not on any antibiotics.  Review of Systems as detailed in HPI.  All others reviewed and are negative.   Objective: Vital Signs: There were no vitals taken for this visit.  Physical Exam well-developed well-nourished gentleman in no acute distress.  Alert and oriented x3.  Ortho Exam right lower extremity exam: He does have a golf ball sized mass/cyst to the proximal tibia.  This is moderately tender.  Mild erythema and warmth.  No drainage.  He has slight tightness to the area with flexion of the knee.  No actual knee pain with ranging the knee.  No joint line tenderness.  No knee effusion.  He is neurovascular intact  distally.  Specialty Comments:  No specialty comments available.  Imaging: XR KNEE 3 VIEW RIGHT  Result Date: 02/03/2022 X-rays demonstrate soft tissue swelling to the proximal tibia.  No acute bony abnormality.    PMFS History: There are no problems to display for this patient.  Past Medical History:  Diagnosis Date   MI, old     Family History  Problem Relation Age of Onset   Diabetes Neg Hx     Past Surgical History:  Procedure Laterality Date   NO PAST SURGERIES     Social History   Occupational History   Not on file  Tobacco Use   Smoking status: Every Day    Packs/day: 0.50    Types: Cigarettes   Smokeless tobacco: Never  Substance and Sexual Activity   Alcohol use: Yes    Comment: occ   Drug use: Yes    Types: Marijuana   Sexual activity: Not Currently

## 2022-02-17 ENCOUNTER — Ambulatory Visit: Payer: Commercial Managed Care - HMO | Admitting: Orthopaedic Surgery

## 2022-02-20 ENCOUNTER — Ambulatory Visit (INDEPENDENT_AMBULATORY_CARE_PROVIDER_SITE_OTHER): Payer: Commercial Managed Care - HMO | Admitting: Primary Care

## 2022-11-30 ENCOUNTER — Ambulatory Visit
Admission: EM | Admit: 2022-11-30 | Discharge: 2022-11-30 | Disposition: A | Discharge status: Home / Self Care | Payer: 59 | Attending: Internal Medicine | Admitting: Internal Medicine

## 2022-11-30 DIAGNOSIS — W57XXXA Bitten or stung by nonvenomous insect and other nonvenomous arthropods, initial encounter: Secondary | ICD-10-CM

## 2022-11-30 DIAGNOSIS — M7989 Other specified soft tissue disorders: Secondary | ICD-10-CM | POA: Diagnosis not present

## 2022-11-30 DIAGNOSIS — S50362A Insect bite (nonvenomous) of left elbow, initial encounter: Secondary | ICD-10-CM

## 2022-11-30 MED ORDER — DEXAMETHASONE SODIUM PHOSPHATE 10 MG/ML IJ SOLN
10.0000 mg | Freq: Once | INTRAMUSCULAR | Status: AC
  Administered 2022-11-30: 10 mg via INTRAMUSCULAR

## 2022-11-30 MED ORDER — DIPHENHYDRAMINE HCL 50 MG/ML IJ SOLN
25.0000 mg | Freq: Once | INTRAMUSCULAR | Status: AC
  Administered 2022-11-30: 25 mg via INTRAMUSCULAR

## 2022-11-30 NOTE — ED Provider Notes (Signed)
[facility]    CSN: [id number] Arrival date & time: 11/30/22  1045      History   Chief Complaint No chief complaint on file.   HPI Bryan Curtis is a 62 y.o. male.   Patient presents today for concern of insect bite or sting present to left antecubital space/forearm that occurred about 1 to 2 hours prior to arrival to urgent care.  Patient reports that he was outside at work as he works at [employer] and was changing a sign when he felt something sting or bite him and then fly away.  He states that he is not allergic to anything that he is aware of but he does have some significant swelling to the left arm.  Denies feelings of throat closing or shortness of breath.  He has not taken any medications for symptoms.     Past Medical History:  Diagnosis Date   MI, old     There are no problems to display for this patient.   Past Surgical History:  Procedure Laterality Date   NO PAST SURGERIES         Home Medications    Prior to Admission medications   Medication Sig Start Date End Date Taking? Authorizing Provider  doxycycline (VIBRAMYCIN) 100 MG capsule Take 1 capsule (100 mg total) by mouth 2 (two) times daily. 12/18/21   Ernie Avena, MD  sulfamethoxazole-trimethoprim (BACTRIM DS) 800-160 MG tablet Take 1 tablet by mouth 2 (two) times daily. 02/03/22   Cristie Hem, PA-C  traMADol (ULTRAM) 50 MG tablet Take 1 tablet (50 mg total) by mouth every 12 (twelve) hours as needed. 02/03/22   Cristie Hem, PA-C    Family History Family History  Problem Relation Age of Onset   Diabetes Neg Hx     Social History Social History   Tobacco Use   Smoking status: Every Day    Packs/day: .5    Types: Cigarettes   Smokeless tobacco: Never  Substance Use Topics   Alcohol use: Yes    Comment: occ   Drug use: Yes    Types: Marijuana     Allergies   Penicillins   Review of Systems Review of Systems Per HPI  Physical Exam Triage Vital Signs ED  Triage Vitals  Enc Vitals Group     BP 11/30/22 1201 119/76     Pulse Rate 11/30/22 1201 95     Resp 11/30/22 1201 16     Temp 11/30/22 1201 98.1 F (36.7 C)     Temp Source 11/30/22 1201 Oral     SpO2 11/30/22 1201 97 %     Weight --      Height --      Head Circumference --      Peak Flow --      Pain Score 11/30/22 1202 10     Pain Loc --      Pain Edu? --      Excl. in GC? --    No data found.  Updated Vital Signs BP 119/76 (BP Location: Left Arm)   Pulse 95   Temp 98.1 F (36.7 C) (Oral)   Resp 16   SpO2 97%   Visual Acuity Right Eye Distance:   Left Eye Distance:   Bilateral Distance:    Right Eye Near:   Left Eye Near:    Bilateral Near:     Physical Exam Constitutional:      General: He is not in acute  distress.    Appearance: Normal appearance. He is not toxic-appearing or diaphoretic.  HENT:     Head: Normocephalic and atraumatic.  Eyes:     Extraocular Movements: Extraocular movements intact.     Conjunctiva/sclera: Conjunctivae normal.  Pulmonary:     Effort: Pulmonary effort is normal.  Skin:    Comments: Patient has erythema and mild swelling present surrounding the antecubital space that extends into the forearm of the left arm.  Mild warmth noted.  Area in the center appears to be insertion site of either sting or bite.  Neurological:     General: No focal deficit present.     Mental Status: He is alert and oriented to person, place, and time. Mental status is at baseline.  Psychiatric:        Mood and Affect: Mood normal.        Behavior: Behavior normal.        Thought Content: Thought content normal.        Judgment: Judgment normal.      UC Treatments / Results  Labs (all labs ordered are listed, but only abnormal results are displayed) Labs Reviewed - No data to display  EKG   Radiology No results found.  Procedures Procedures (including critical care time)  Medications Ordered in UC Medications  dexamethasone  (DECADRON) injection 10 mg (10 mg Intramuscular Given 11/30/22 1238)  diphenhydrAMINE (BENADRYL) injection 25 mg (25 mg Intramuscular Given 11/30/22 1238)    Initial Impression / Assessment and Plan / UC Course  I have reviewed the triage vital signs and the nursing notes.  Pertinent labs & imaging results that were available during my care of the patient were reviewed by me and considered in my medical decision making (see chart for details).     Most suspicious of bee sting but other differentials include insect bite.  Patient has swelling noticeable so will treat with IM steroid and IM Benadryl today in urgent care.  Patient has had steroids previously and tolerated well.  No signs of anaphylaxis on exam that would need emergent evaluation or epinephrine administration.  Advised cool compresses for comfort and antihistamines at home.  Patient reports that he is not driving himself and not returning to work so Benadryl should be safe but did remind patient that Benadryl can make him sleepy.  Advised strict return ER precautions.  Patient verbalized understanding and was agreeable with plan. Final Clinical Impressions(s) / UC Diagnoses   Final diagnoses:  Left arm swelling  Insect bite of left elbow, initial encounter     Discharge Instructions      You were given 2 injections today to help alleviate inflammation and allergic reaction.  Please follow-up with any further or persistent symptoms.    ED Prescriptions   None    PDMP not reviewed this encounter.   Gustavus Bryant, [place] 11/30/22 1240

## 2022-11-30 NOTE — Discharge Instructions (Signed)
You were given 2 injections today to help alleviate inflammation and allergic reaction.  Please follow-up with any further or persistent symptoms.

## 2022-11-30 NOTE — ED Triage Notes (Signed)
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 Inside the arm side fever at site.

## 2023-12-18 ENCOUNTER — Encounter (HOSPITAL_COMMUNITY): Payer: Self-pay

## 2023-12-18 ENCOUNTER — Ambulatory Visit (HOSPITAL_COMMUNITY)
Admission: EM | Admit: 2023-12-18 | Discharge: 2023-12-18 | Disposition: A | Discharge status: Home / Self Care | Attending: Family Medicine | Admitting: Family Medicine

## 2023-12-18 DIAGNOSIS — R21 Rash and other nonspecific skin eruption: Secondary | ICD-10-CM

## 2023-12-18 MED ORDER — DOXYCYCLINE HYCLATE 100 MG PO CAPS: 100.0000 mg | ORAL_CAPSULE | Freq: Two times a day (BID) | ORAL | 0 refills | Status: AC

## 2023-12-18 NOTE — ED Provider Notes (Signed)
 [facility]   [id number] 12/18/23 Arrival Time: 1107  ASSESSMENT & PLAN:  1. Rash and nonspecific skin eruption    Ques insect bites. Without abscess formation. One area of upper R back with surrounding erythema and warm to touch. Will cover for skin infection. Will also cover ror tick-borne given tick bite to penis recently. Begin: Meds ordered this encounter  Medications   doxycycline  (VIBRAMYCIN ) 100 MG capsule    Sig: Take 1 capsule (100 mg total) by mouth 2 (two) times daily.    Dispense:  20 capsule    Refill:  0    Follow-up Information     Marius Siemens, NP.   Specialty: Internal Medicine Why: As needed. Contact information: [address] [place] [address] [phone]                Reviewed expectations re: course of current medical issues. Questions answered. Outlined signs and symptoms indicating need for more acute intervention. Patient verbalized understanding. After Visit Summary given.   SUBJECTIVE:  Bryan Curtis is a 63 y.o. male who presents with a skin complaint. Patient here today with c/o rash on the back of his neck while waiting at a buss stop this past Sunday. Patient states that the rash spread down his arms and on his chest and face. Patient has been using Cortizone 10 and alcohol with some relief but he still has the rash on his neck.   Patient also started that 2 Sundays ago, he had a tick on his penis. He states that the area on his penis swelling but went down. He is not sure if they are related.    OBJECTIVE: Vitals:   12/18/23 1225  BP: 133/82  Pulse: 85  Resp: 16  Temp: 98.1 F (36.7 C)  TempSrc: Oral  SpO2: 97%    General appearance: alert; no distress HEENT: [place]; AT Extremities: no edema; moves all extremities normally Skin: warm and dry; 3 distinct indurations over upper to R back with most lateral one surrounded by 2 inches erythema that is warm to touch; without signs of abscess  formation Psychological: alert and cooperative; normal mood and affect  Allergies  Allergen Reactions   Penicillins Other (See Comments)    Childhood reaction  Did it involve swelling of the face/tongue/throat, SOB, or low BP? Unknown Did it involve sudden or severe rash/hives, skin peeling, or any reaction on the inside of your mouth or nose? Unknown Did you need to seek medical attention at a hospital or doctor's office? Unknown When did it last happen?   pt was young child    If all above answers are "NO", may proceed with cephalosporin use.     Past Medical History:  Diagnosis Date   MI, old    Social History   Socioeconomic History   Marital status: Single    Spouse name: Not on file   Number of children: Not on file   Years of education: Not on file   Highest education level: Not on file  Occupational History   Not on file  Tobacco Use   Smoking status: Every Day    Current packs/day: 0.50    Types: Cigarettes   Smokeless tobacco: Never  Substance and Sexual Activity   Alcohol use: Yes    Comment: occ   Drug use: Yes    Types: Marijuana   Sexual activity: Not Currently  Other Topics Concern   Not on file  Social History Narrative  Not on file   Social Drivers of Health   Financial Resource Strain: Not on file  Food Insecurity: Not on file  Transportation Needs: Not on file  Physical Activity: Not on file  Stress: Not on file  Social Connections: Not on file  Intimate Partner Violence: Not on file   Family History  Problem Relation Age of Onset   Diabetes Neg Hx    Past Surgical History:  Procedure Laterality Date   NO PAST SURGERIES        Afton Albright, MD 12/18/23 1506

## 2023-12-18 NOTE — ED Triage Notes (Signed)
 Patient here today with c/o rash on the back of his neck while waiting at a buss stop this past Sunday. Patient states that the rash spread down his arms and on his chest and face. Patient has been using Cortizone 10 and alcohol with some relief but he still has the rash on his neck.   Patient also started that 2 Sundays ago, he had a tick on his penis. He states that the area on his penis swelling but went down. He is not sure if they are related.

## 2024-06-04 ENCOUNTER — Emergency Department (HOSPITAL_BASED_OUTPATIENT_CLINIC_OR_DEPARTMENT_OTHER)
Admission: EM | Admit: 2024-06-04 | Discharge: 2024-06-04 | Disposition: A | Discharge status: Home / Self Care | Attending: Emergency Medicine | Admitting: Emergency Medicine

## 2024-06-04 ENCOUNTER — Other Ambulatory Visit: Payer: Self-pay

## 2024-06-04 ENCOUNTER — Encounter (HOSPITAL_BASED_OUTPATIENT_CLINIC_OR_DEPARTMENT_OTHER): Payer: Self-pay

## 2024-06-04 ENCOUNTER — Emergency Department (HOSPITAL_BASED_OUTPATIENT_CLINIC_OR_DEPARTMENT_OTHER)

## 2024-06-04 DIAGNOSIS — S9031XA Contusion of right foot, initial encounter: Secondary | ICD-10-CM | POA: Diagnosis present

## 2024-06-04 MED ORDER — IBUPROFEN 800 MG PO TABS
800.0000 mg | ORAL_TABLET | Freq: Once | ORAL | Status: AC
  Administered 2024-06-04: 800 mg via ORAL
  Filled 2024-06-04: qty 1

## 2024-06-04 MED ORDER — ACETAMINOPHEN 500 MG PO TABS
1000.0000 mg | ORAL_TABLET | Freq: Once | ORAL | Status: AC
  Administered 2024-06-04: 1000 mg via ORAL
  Filled 2024-06-04: qty 2
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
## 2024-06-04 NOTE — Discharge Instructions (Signed)
 You were seen in the emergency room for right foot injury There is no evidence of fracture or dislocation on the x-ray This is most likely a contusion (bruising) of your right foot We gave you a walking boot to go home with Take Tylenol  or Motrin  for pain You will have more swelling and bruising over the next couple days Return to the emergency room for severe pain or if you are unable to walk
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
## 2024-06-04 NOTE — ED Notes (Signed)
 Called [facility] for transport 18:00-TC
